# Patient Record
Sex: Male | Born: 1945 | Race: Black or African American | Hispanic: No | Smoking: Former smoker
Health system: Southern US, Community
[De-identification: ages and names within clinical notes are randomized; demographics above are authoritative.]

## PROBLEM LIST (undated history)

## (undated) DIAGNOSIS — F509 Eating disorder, unspecified: Secondary | ICD-10-CM

## (undated) DIAGNOSIS — M1990 Unspecified osteoarthritis, unspecified site: Secondary | ICD-10-CM

## (undated) HISTORY — DX: Eating disorder, unspecified: F50.9

## (undated) HISTORY — DX: Unspecified osteoarthritis, unspecified site: M19.90

## (undated) HISTORY — PX: HERNIA REPAIR: SHX51

---

## 2000-02-29 ENCOUNTER — Encounter: Admission: RE | Admit: 2000-02-29 | Discharge: 2000-02-29 | Payer: Self-pay | Admitting: Family Medicine

## 2000-02-29 ENCOUNTER — Encounter: Payer: Self-pay | Admitting: Family Medicine

## 2010-12-06 ENCOUNTER — Emergency Department (HOSPITAL_COMMUNITY)
Admission: EM | Admit: 2010-12-06 | Discharge: 2010-12-06 | Payer: Self-pay | Source: Home / Self Care | Admitting: Emergency Medicine

## 2010-12-13 LAB — CBC
HCT: 42.3 % (ref 39.0–52.0)
Hemoglobin: 13.9 g/dL (ref 13.0–17.0)
MCH: 29.1 pg (ref 26.0–34.0)
MCHC: 32.9 g/dL (ref 30.0–36.0)
MCV: 88.5 fL (ref 78.0–100.0)
Platelets: 215 10*3/uL (ref 150–400)
RBC: 4.78 MIL/uL (ref 4.22–5.81)
RDW: 14 % (ref 11.5–15.5)
WBC: 5.5 10*3/uL (ref 4.0–10.5)

## 2010-12-13 LAB — POCT I-STAT, CHEM 8
BUN: 18 mg/dL (ref 6–23)
Calcium, Ion: 1.11 mmol/L — ABNORMAL LOW (ref 1.12–1.32)
Chloride: 105 mEq/L (ref 96–112)
Creatinine, Ser: 1.4 mg/dL (ref 0.4–1.5)
Glucose, Bld: 97 mg/dL (ref 70–99)
HCT: 46 % (ref 39.0–52.0)
Hemoglobin: 15.6 g/dL (ref 13.0–17.0)
Potassium: 4.1 mEq/L (ref 3.5–5.1)
Sodium: 140 mEq/L (ref 135–145)
TCO2: 27 mmol/L (ref 0–100)

## 2010-12-13 LAB — DIFFERENTIAL
Basophils Absolute: 0 10*3/uL (ref 0.0–0.1)
Basophils Relative: 1 % (ref 0–1)
Eosinophils Absolute: 0.1 10*3/uL (ref 0.0–0.7)
Eosinophils Relative: 2 % (ref 0–5)
Lymphocytes Relative: 28 % (ref 12–46)
Lymphs Abs: 1.6 10*3/uL (ref 0.7–4.0)
Monocytes Absolute: 0.6 10*3/uL (ref 0.1–1.0)
Monocytes Relative: 11 % (ref 3–12)
Neutro Abs: 3.2 10*3/uL (ref 1.7–7.7)
Neutrophils Relative %: 57 % (ref 43–77)

## 2010-12-13 LAB — GLUCOSE, CAPILLARY: Glucose-Capillary: 103 mg/dL — ABNORMAL HIGH (ref 70–99)

## 2010-12-13 LAB — POCT CARDIAC MARKERS
CKMB, poc: <1 ng/mL — ABNORMAL LOW (ref 1.0–8.0)
Myoglobin, poc: 49.3 ng/mL (ref 12–200)
Troponin i, poc: <0.05 ng/mL (ref 0.00–0.09)

## 2013-12-24 ENCOUNTER — Ambulatory Visit: Payer: Self-pay | Admitting: Internal Medicine

## 2015-08-31 ENCOUNTER — Ambulatory Visit (INDEPENDENT_AMBULATORY_CARE_PROVIDER_SITE_OTHER)
Admission: RE | Admit: 2015-08-31 | Discharge: 2015-08-31 | Disposition: A | Discharge status: Home / Self Care | Payer: Medicare Other | Source: Ambulatory Visit | Attending: Primary Care | Admitting: Primary Care

## 2015-08-31 ENCOUNTER — Telehealth: Payer: Self-pay | Admitting: Primary Care

## 2015-08-31 ENCOUNTER — Ambulatory Visit (INDEPENDENT_AMBULATORY_CARE_PROVIDER_SITE_OTHER): Payer: Medicare Other | Admitting: Primary Care

## 2015-08-31 ENCOUNTER — Encounter: Payer: Self-pay | Admitting: Primary Care

## 2015-08-31 VITALS — BP 152/90 | HR 108 | Temp 98.1°F | Ht 75.25 in | Wt 176.0 lb

## 2015-08-31 DIAGNOSIS — R030 Elevated blood-pressure reading, without diagnosis of hypertension: Secondary | ICD-10-CM | POA: Diagnosis not present

## 2015-08-31 DIAGNOSIS — M25561 Pain in right knee: Secondary | ICD-10-CM

## 2015-08-31 DIAGNOSIS — M25461 Effusion, right knee: Secondary | ICD-10-CM | POA: Diagnosis not present

## 2015-08-31 DIAGNOSIS — M25569 Pain in unspecified knee: Secondary | ICD-10-CM | POA: Insufficient documentation

## 2015-08-31 DIAGNOSIS — IMO0001 Reserved for inherently not codable concepts without codable children: Secondary | ICD-10-CM | POA: Insufficient documentation

## 2015-08-31 NOTE — Progress Notes (Signed)
Subjective:    Patient ID: Albert Mendoza, male    DOB: 07/14/1946, 69 y.o.   MRN: [id number]  HPI  Albert Mendoza is a 69 year old male who presents today to establish care and discuss the problems mentioned below. Will obtain old records. He's not had care with a PCP in over 8 years.  1) Knee pain: Located to the medial aspect of the right knee and has been present for the past 2 weeks. He will notice a "crackle" sound intermittently when standing and notices stiffness after resting for a while. He also notices intermittent tingling to the tips of the toes of his bilateral feet. Denies recent injury, swelling, back pain. He is active at home with yard work and walks a lot daily. He's taken aleve arthritis without improvement in symptoms.   2) Elevated Blood Pressure Reading: Elevated in the clinic today. He's had no history of elevated blood pressure readings in the past. Denies chest pain, headaches, shortness of breath.   3) Alcohol Consumption: He will drink 3-4 ounces of Brandi daily and has done so for the past year since his wife's passing. PHQ 2 score of 0. Denies abdominal pain, changes in skin color.  Review of Systems  Constitutional: Negative for unexpected weight change.  HENT: Negative for rhinorrhea.   Respiratory: Negative for cough and shortness of breath.   Cardiovascular: Negative for chest pain.  Gastrointestinal: Negative for diarrhea and constipation.  Genitourinary: Negative for difficulty urinating.  Musculoskeletal:       Right knee pain, see HPI  Skin: Negative for rash.  Allergic/Immunologic: Negative for environmental allergies.  Neurological: Positive for numbness. Negative for dizziness and headaches.  Psychiatric/Behavioral:       Denies concerns for anxiety or depression. He is grieving the loss of his wife.       Past Medical History  Diagnosis Date  . Arthritis   . Eating disorder     Social History   Social History  . Marital Status: Widowed     Spouse Name: N/A  . Number of Children: N/A  . Years of Education: N/A   Occupational History  . Not on file.   Social History Main Topics  . Smoking status: Never Smoker   . Smokeless tobacco: Not on file  . Alcohol Use: 0.0 oz/week    0 Standard drinks or equivalent per week     Comment: about 2 oz a day  . Drug Use: Not on file  . Sexual Activity: Not on file   Other Topics Concern  . Not on file   Social History Narrative  . No narrative on file    History reviewed. No pertinent past surgical history.  History reviewed. No pertinent family history.  Allergies not on file  No current outpatient prescriptions on file prior to visit.   No current facility-administered medications on file prior to visit.    BP 152/90 mmHg  Pulse 108  Temp(Src) 98.1 F (36.7 C) (Oral)  Ht 6' 3.25" (1.911 m)  Wt 176 lb (79.833 kg)  BMI 21.86 kg/m2  SpO2 98%    Objective:   Physical Exam  Constitutional: He is oriented to person, place, and time. He appears well-nourished.  HENT:  Head: Normocephalic.  Cardiovascular: Normal rate and regular rhythm.   Pulmonary/Chest: Effort normal and breath sounds normal.  Musculoskeletal:       Right knee: He exhibits decreased range of motion. He exhibits no swelling, no deformity and normal alignment. Tenderness  found. Medial joint line tenderness noted.  Neurological: He is alert and oriented to person, place, and time.  Skin: Skin is warm and dry.  Psychiatric: He has a normal mood and affect.          Assessment & Plan:  Alcohol consumption:  Drinks ETOH every night since wife's passing 1 year ago. PHQ 2 score of 0 today. Will continue to monitor and screen for depression.

## 2015-08-31 NOTE — Progress Notes (Signed)
Pre visit review using our clinic review tool, if applicable. No additional management support is needed unless otherwise documented below in the visit note. 

## 2015-08-31 NOTE — Assessment & Plan Note (Signed)
Located to medial aspect of right knee. Present for 2 weeks without relief from ibuprofen. Stiffness, some crepitus. No injury. Decreased ROM during exam without laxity or deformity. Xrays pending. Tylenol, knee brace, ice, rest. Suspect arthritis and will treat with supportive measures.

## 2015-08-31 NOTE — Telephone Encounter (Signed)
See results note for x-ray on 08/31/15.

## 2015-08-31 NOTE — Telephone Encounter (Signed)
Pt returned your call, please call back at [phone]

## 2015-08-31 NOTE — Patient Instructions (Addendum)
Complete xray(s) prior to leaving today. I will contact you regarding your results.  For knee pain:  Apply a knee brace/wrap to use when walking.  Ice knee three times daily for about 20 minutes at a time.  You may take tylenol 500 mg four times daily as needed for pain or ibuprofen 600 mg three times daily as needed for pain.  Please schedule a physical with me in the next 1-2 months. You will also schedule a lab only appointment one week prior. We will discuss your lab results during your physical.  It was a pleasure to meet you today! Please don't hesitate to call me with any questions. Welcome to [facility]!     Knee Pain The knee is the complex joint between your thigh and your lower leg. It is made up of bones, tendons, ligaments, and cartilage. The bones that make up the knee are:  The femur in the thigh.  The tibia and fibula in the lower leg.  The patella or kneecap riding in the groove on the lower femur. CAUSES  Knee pain is a common complaint with many causes. A few of these causes are:  Injury, such as:  A ruptured ligament or tendon injury.  Torn cartilage.  Medical conditions, such as:  Gout  Arthritis  Infections  Overuse, over training, or overdoing a physical activity. Knee pain can be minor or severe. Knee pain can accompany debilitating injury. Minor knee problems often respond well to self-care measures or get well on their own. More serious injuries may need medical intervention or even surgery. SYMPTOMS The knee is complex. Symptoms of knee problems can vary widely. Some of the problems are:  Pain with movement and weight bearing.  Swelling and tenderness.  Buckling of the knee.  Inability to straighten or extend your knee.  Your knee locks and you cannot straighten it.  Warmth and redness with pain and fever.  Deformity or dislocation of the kneecap. DIAGNOSIS  Determining what is wrong may be very straight forward such as when there  is an injury. It can also be challenging because of the complexity of the knee. Tests to make a diagnosis may include:  Your caregiver taking a history and doing a physical exam.  Routine X-rays can be used to rule out other problems. X-rays will not reveal a cartilage tear. Some injuries of the knee can be diagnosed by:  Arthroscopy a surgical technique by which a small video camera is inserted through tiny incisions on the sides of the knee. This procedure is used to examine and repair internal knee joint problems. Tiny instruments can be used during arthroscopy to repair the torn knee cartilage (meniscus).  Arthrography is a radiology technique. A contrast liquid is directly injected into the knee joint. Internal structures of the knee joint then become visible on X-ray film.  An MRI scan is a non X-ray radiology procedure in which magnetic fields and a computer produce two- or three-dimensional images of the inside of the knee. Cartilage tears are often visible using an MRI scanner. MRI scans have largely replaced arthrography in diagnosing cartilage tears of the knee.  Blood work.  Examination of the fluid that helps to lubricate the knee joint (synovial fluid). This is done by taking a sample out using a needle and a syringe. TREATMENT The treatment of knee problems depends on the cause. Some of these treatments are:  Depending on the injury, proper casting, splinting, surgery, or physical therapy care will be needed.  Give yourself adequate recovery time. Do not overuse your joints. If you begin to get sore during workout routines, back off. Slow down or do fewer repetitions.  For repetitive activities such as cycling or running, maintain your strength and nutrition.  Alternate muscle groups. For example, if you are a weight lifter, work the upper body on one day and the lower body the next.  Either tight or weak muscles do not give the proper support for your knee. Tight or weak  muscles do not absorb the stress placed on the knee joint. Keep the muscles surrounding the knee strong.  Take care of mechanical problems.  If you have flat feet, orthotics or special shoes may help. See your caregiver if you need help.  Arch supports, sometimes with wedges on the inner or outer aspect of the heel, can help. These can shift pressure away from the side of the knee most bothered by osteoarthritis.  A brace called an "unloader" brace also may be used to help ease the pressure on the most arthritic side of the knee.  If your caregiver has prescribed crutches, braces, wraps or ice, use as directed. The acronym for this is PRICE. This means protection, rest, ice, compression, and elevation.  Nonsteroidal anti-inflammatory drugs (NSAIDs), can help relieve pain. But if taken immediately after an injury, they may actually increase swelling. Take NSAIDs with food in your stomach. Stop them if you develop stomach problems. Do not take these if you have a history of ulcers, stomach pain, or bleeding from the bowel. Do not take without your caregiver's approval if you have problems with fluid retention, heart failure, or kidney problems.  For ongoing knee problems, physical therapy may be helpful.  Glucosamine and chondroitin are over-the-counter dietary supplements. Both may help relieve the pain of osteoarthritis in the knee. These medicines are different from the usual anti-inflammatory drugs. Glucosamine may decrease the rate of cartilage destruction.  Injections of a corticosteroid drug into your knee joint may help reduce the symptoms of an arthritis flare-up. They may provide pain relief that lasts a few months. You may have to wait a few months between injections. The injections do have a small increased risk of infection, water retention, and elevated blood sugar levels.  Hyaluronic acid injected into damaged joints may ease pain and provide lubrication. These injections may work by  reducing inflammation. A series of shots may give relief for as long as 6 months.  Topical painkillers. Applying certain ointments to your skin may help relieve the pain and stiffness of osteoarthritis. Ask your pharmacist for suggestions. Many over the-counter products are approved for temporary relief of arthritis pain.  In some countries, doctors often prescribe topical NSAIDs for relief of chronic conditions such as arthritis and tendinitis. A review of treatment with NSAID creams found that they worked as well as oral medications but without the serious side effects. PREVENTION  Maintain a healthy weight. Extra pounds put more strain on your joints.  Get strong, stay limber. Weak muscles are a common cause of knee injuries. Stretching is important. Include flexibility exercises in your workouts.  Be smart about exercise. If you have osteoarthritis, chronic knee pain or recurring injuries, you may need to change the way you exercise. This does not mean you have to stop being active. If your knees ache after jogging or playing basketball, consider switching to swimming, water aerobics, or other low-impact activities, at least for a few days a week. Sometimes limiting high-impact activities will provide  relief.  Make sure your shoes fit well. Choose footwear that is right for your sport.  Protect your knees. Use the proper gear for knee-sensitive activities. Use kneepads when playing volleyball or laying carpet. Buckle your seat belt every time you drive. Most shattered kneecaps occur in car accidents.  Rest when you are tired. SEEK MEDICAL CARE IF:  You have knee pain that is continual and does not seem to be getting better.  SEEK IMMEDIATE MEDICAL CARE IF:  Your knee joint feels hot to the touch and you have a high fever. MAKE SURE YOU:   Understand these instructions.  Will watch your condition.  Will get help right away if you are not doing well or get worse. Document Released:  09/11/2007 Document Revised: 02/06/2012 Document Reviewed: 09/11/2007 [facility] Patient Information 2015 [place], [place]. This information is not intended to replace advice given to you by your health care provider. Make sure you discuss any questions you have with your health care provider.

## 2015-08-31 NOTE — Assessment & Plan Note (Signed)
Slightly above goal in the clinic today at 150/92. He has no prior history of elevated readings in the past. Denies chest pain, SOB, headaches. Will continue to closely monitor.

## 2015-10-29 ENCOUNTER — Other Ambulatory Visit: Payer: Medicare Other

## 2015-11-04 ENCOUNTER — Encounter: Payer: Medicare Other | Admitting: Primary Care

## 2015-12-23 ENCOUNTER — Other Ambulatory Visit: Payer: Self-pay | Admitting: Primary Care

## 2015-12-23 DIAGNOSIS — Z0000 Encounter for general adult medical examination without abnormal findings: Secondary | ICD-10-CM

## 2015-12-23 DIAGNOSIS — Z125 Encounter for screening for malignant neoplasm of prostate: Secondary | ICD-10-CM

## 2015-12-23 DIAGNOSIS — Z13220 Encounter for screening for lipoid disorders: Secondary | ICD-10-CM

## 2015-12-23 DIAGNOSIS — R030 Elevated blood-pressure reading, without diagnosis of hypertension: Secondary | ICD-10-CM

## 2015-12-23 DIAGNOSIS — IMO0001 Reserved for inherently not codable concepts without codable children: Secondary | ICD-10-CM

## 2015-12-31 ENCOUNTER — Other Ambulatory Visit: Payer: Self-pay

## 2016-01-07 ENCOUNTER — Encounter: Payer: Medicare Other | Admitting: Primary Care

## 2021-09-20 ENCOUNTER — Emergency Department (HOSPITAL_COMMUNITY)
Admission: EM | Admit: 2021-09-20 | Discharge: 2021-09-20 | Disposition: A | Discharge status: Home / Self Care | Payer: Medicare PPO | Attending: Emergency Medicine | Admitting: Emergency Medicine

## 2021-09-20 ENCOUNTER — Emergency Department (HOSPITAL_COMMUNITY): Payer: Medicare PPO

## 2021-09-20 DIAGNOSIS — Z23 Encounter for immunization: Secondary | ICD-10-CM | POA: Diagnosis not present

## 2021-09-20 DIAGNOSIS — W19XXXA Unspecified fall, initial encounter: Secondary | ICD-10-CM

## 2021-09-20 DIAGNOSIS — S0990XA Unspecified injury of head, initial encounter: Secondary | ICD-10-CM

## 2021-09-20 DIAGNOSIS — M47892 Other spondylosis, cervical region: Secondary | ICD-10-CM | POA: Diagnosis not present

## 2021-09-20 DIAGNOSIS — W01198A Fall on same level from slipping, tripping and stumbling with subsequent striking against other object, initial encounter: Secondary | ICD-10-CM | POA: Insufficient documentation

## 2021-09-20 DIAGNOSIS — S022XXA Fracture of nasal bones, initial encounter for closed fracture: Secondary | ICD-10-CM | POA: Diagnosis not present

## 2021-09-20 DIAGNOSIS — S0181XA Laceration without foreign body of other part of head, initial encounter: Secondary | ICD-10-CM | POA: Diagnosis not present

## 2021-09-20 DIAGNOSIS — S022XXB Fracture of nasal bones, initial encounter for open fracture: Secondary | ICD-10-CM

## 2021-09-20 DIAGNOSIS — M47812 Spondylosis without myelopathy or radiculopathy, cervical region: Secondary | ICD-10-CM

## 2021-09-20 LAB — CBC WITH DIFFERENTIAL/PLATELET
Abs Immature Granulocytes: 0.03 10*3/uL (ref 0.00–0.07)
Basophils Absolute: 0.1 10*3/uL (ref 0.0–0.1)
Basophils Relative: 1 %
Eosinophils Absolute: 0 10*3/uL (ref 0.0–0.5)
Eosinophils Relative: 0 %
HCT: 44.8 % (ref 39.0–52.0)
Hemoglobin: 15 g/dL (ref 13.0–17.0)
Immature Granulocytes: 0 %
Lymphocytes Relative: 11 %
Lymphs Abs: 1 10*3/uL (ref 0.7–4.0)
MCH: 30.7 pg (ref 26.0–34.0)
MCHC: 33.5 g/dL (ref 30.0–36.0)
MCV: 91.8 fL (ref 80.0–100.0)
Monocytes Absolute: 0.7 10*3/uL (ref 0.1–1.0)
Monocytes Relative: 7 %
Neutro Abs: 7.7 10*3/uL (ref 1.7–7.7)
Neutrophils Relative %: 81 %
Platelets: 213 10*3/uL (ref 150–400)
RBC: 4.88 MIL/uL (ref 4.22–5.81)
RDW: 14.7 % (ref 11.5–15.5)
WBC: 9.5 10*3/uL (ref 4.0–10.5)
nRBC: 0 % (ref 0.0–0.2)

## 2021-09-20 LAB — BASIC METABOLIC PANEL
Anion gap: 8 (ref 5–15)
BUN: 13 mg/dL (ref 8–23)
CO2: 28 mmol/L (ref 22–32)
Calcium: 8.8 mg/dL — ABNORMAL LOW (ref 8.9–10.3)
Chloride: 102 mmol/L (ref 98–111)
Creatinine, Ser: 1.24 mg/dL (ref 0.61–1.24)
GFR, Estimated: >60 mL/min (ref >60)
Glucose, Bld: 114 mg/dL — ABNORMAL HIGH (ref 70–99)
Potassium: 4.3 mmol/L (ref 3.5–5.1)
Sodium: 138 mmol/L (ref 135–145)

## 2021-09-20 MED ORDER — OXYCODONE-ACETAMINOPHEN 5-325 MG PO TABS
1.0000 | ORAL_TABLET | Freq: Once | ORAL | Status: AC
  Administered 2021-09-20: 1 via ORAL
  Filled 2021-09-20: qty 1

## 2021-09-20 MED ORDER — LIDOCAINE-EPINEPHRINE (PF) 2 %-1:200000 IJ SOLN
20.0000 mL | Freq: Once | INTRAMUSCULAR | Status: AC
  Administered 2021-09-20: 20 mL
  Filled 2021-09-20: qty 20

## 2021-09-20 MED ORDER — TETANUS-DIPHTH-ACELL PERTUSSIS 5-2.5-18.5 LF-MCG/0.5 IM SUSY
0.5000 mL | PREFILLED_SYRINGE | Freq: Once | INTRAMUSCULAR | Status: AC
  Administered 2021-09-20: 0.5 mL via INTRAMUSCULAR
  Filled 2021-09-20: qty 0.5

## 2021-09-20 NOTE — Discharge Instructions (Addendum)
There were no serious injuries from the fall.  You do have a nose fracture that will likely heal without problems.  There is a small risk of it getting infected, which may require additional treatment.  Watch for signs of increasing pain, drainage or swelling of the nose.  Clean the wounds with soap and water daily and apply an antibiotic ointment such as Neosporin or bacitracin.  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  You also likely has some arthritis in your knee which contributed to the fall.  Treatment for this should be adequate with Tylenol.  We sent some blood work at your request, that can be followed through MyChart.  Also discussed the results with the medical provider when you see them for suture removal in 5 to 7 days.  Return here, if needed for problems.

## 2021-09-20 NOTE — ED Provider Notes (Signed)
Emergency Medicine Provider Triage Evaluation Note  Albert Mendoza , a 75 y.o. male  was evaluated in triage.  Pt complains of acute fall just prior to arrival.  Patient states that when he got up to go to the bathroom his knees gave out and he fell striking the floor with his face.  Denies dizziness, loss of consciousness.  Patient reports he does not take blood thinners.  Has associated neck pain and bilateral shoulder pain.  Initial triage stated pain in his knees however denies any pain at this time.  Has been ambulatory since then.  Review of Systems  Positive: Laceration, neck pain, shoulder pain Negative: Of consciousness  Physical Exam  BP 130/81 (BP Location: Right Arm)   Pulse (!) 105   Temp (!) 97.3 F (36.3 C) (Oral)   Resp 18   SpO2 99%  Gen:   Awake, no distress   Resp:  Normal effort  MSK:   Moves extremities without difficulty, strength 5/5 in the bilateral upper extremities.  No midline cervical tenderness or step-off. Other:  6cm laceration to the forehead and nose  Medical Decision Making  Medically screening exam initiated at 5:40 AM.  Appropriate orders placed.  Albert Mendoza was informed that the remainder of the evaluation will be completed by another provider, this initial triage assessment does not replace that evaluation, and the importance of remaining in the ED until their evaluation is complete.  Fall, facial laceration   Albert Mendoza 09/20/21 0543    Albert Lyons, MD 09/21/21 [phone]

## 2021-09-20 NOTE — ED Provider Notes (Signed)
[facility] EMERGENCY DEPARTMENT Provider Note   CSN: [id number] Arrival date & time: 09/20/21  0447     History Chief Complaint  Patient presents with   Lytle Michaels    Albert Mendoza is a 75 y.o. male.  HPI He presents for evaluation of an injury which occurred when he stood up from a couch where he had been watching TV.  His left knee gave way which caused him to fall forward onto his left knee.  He was able ambulate afterwards.  He noticed injuries to his face.  He denies neck pain, back pain, headache, focal weakness or paresthesia.  Does not feel like he lost consciousness.  He is here with his daughter.  There are no other known active modifying factors.    Past Medical History:  Diagnosis Date   Arthritis    Eating disorder     Patient Active Problem List   Diagnosis Date Noted   Knee pain, acute 08/31/2015   Elevated blood pressure 08/31/2015    No past surgical history on file.     No family history on file.  Social History   Tobacco Use   Smoking status: Never  Substance Use Topics   Alcohol use: Yes    Alcohol/week: 0.0 standard drinks    Comment: about 2 oz a day    Home Medications Prior to Admission medications   Medication Sig Start Date End Date Taking? Authorizing Provider  ibuprofen (ADVIL,MOTRIN) 200 MG tablet Take 200 mg by mouth as needed.    [provider]    Allergies    Patient has no known allergies.  Review of Systems   Review of Systems  All other systems reviewed and are negative.  Physical Exam Updated Vital Signs BP (!) 153/91   Pulse 90   Temp (!) 97.3 F (36.3 C) (Oral)   Resp 16   SpO2 100%   Physical Exam Vitals and nursing note reviewed.  Constitutional:      General: He is not in acute distress.    Appearance: He is well-developed. He is not ill-appearing, toxic-appearing or diaphoretic.  HENT:     Head: Normocephalic.     Comments: Laceration mid forehead, gaping, Y-shaped.   Laceration nose with deformity, left-sided swelling.  No active bleeding from the nares.  Normal TMJ motion.  No dental trauma.  No midface crepitation or deformity.    Right Ear: External ear normal.     Left Ear: External ear normal.     Nose: No congestion or rhinorrhea.     Mouth/Throat:     Mouth: Mucous membranes are moist.  Eyes:     Conjunctiva/sclera: Conjunctivae normal.     Pupils: Pupils are equal, round, and reactive to light.  Neck:     Trachea: Phonation normal.  Cardiovascular:     Rate and Rhythm: Normal rate and regular rhythm.     Heart sounds: Normal heart sounds.  Pulmonary:     Effort: Pulmonary effort is normal. No respiratory distress.     Breath sounds: Normal breath sounds. No stridor.  Abdominal:     Palpations: Abdomen is soft.     Tenderness: There is no abdominal tenderness.  Musculoskeletal:        General: Tenderness (Left knee, mild.) present. Normal range of motion.     Cervical back: Normal range of motion and neck supple.     Comments: Normal range of motion, arms and legs.  Skin:    General:  Skin is warm and dry.  Neurological:     Mental Status: He is alert and oriented to person, place, and time.     Cranial Nerves: No cranial nerve deficit.     Sensory: No sensory deficit.     Motor: No abnormal muscle tone.     Coordination: Coordination normal.  Psychiatric:        Mood and Affect: Mood normal.        Behavior: Behavior normal.        Thought Content: Thought content normal.        Judgment: Judgment normal.    ED Results / Procedures / Treatments   Labs (all labs ordered are listed, but only abnormal results are displayed) Labs Reviewed  BASIC METABOLIC PANEL  CBC WITH DIFFERENTIAL/PLATELET    EKG None  Radiology CT HEAD WO CONTRAST (5MM)  Result Date: 09/20/2021 CLINICAL DATA:  75 year old male status post fall at home. EXAM: CT HEAD WITHOUT CONTRAST TECHNIQUE: Contiguous axial images were obtained from the base of the  skull through the vertex without intravenous contrast. COMPARISON:  None. FINDINGS: Brain: No midline shift, ventriculomegaly, mass effect, evidence of mass lesion, intracranial hemorrhage or evidence of cortically based acute infarction. Mild generalized cerebral volume loss. Small area of chronic appearing cortical encephalomalacia at the left superior perirolandic cortex on series 3, image 22. But otherwise gray-white matter differentiation appears within normal limits for age. Vascular: No suspicious intracranial vascular hyperdensity. Skull: No fracture identified. Sinuses/Orbits: Visualized paranasal sinuses and mastoids are clear. Other: Deep left forehead scalp laceration appears to extend to the outer periosteum on series 4, image 14. Associated bridge of nose and other regional forehead soft tissue swelling and/or hematoma. No tracking soft tissue gas. Sebaceous or dermal type cyst at the vertex. Visible orbits soft tissues remain normal. IMPRESSION: 1. Deep forehead scalp laceration which might extend to the periosteum of the frontal bone. Bridge of nose and other regional forehead soft tissue swelling. No underlying skull fracture identified. See also Face CT. 2. No acute intracranial abnormality. 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 Electronically Signed   By: Genevie Ann M.D.   On: 09/20/2021 07:35   CT Cervical Spine Wo Contrast  Result Date: 09/20/2021 CLINICAL DATA:  75 year old male status post fall at home. EXAM: CT CERVICAL SPINE WITHOUT CONTRAST TECHNIQUE: Multidetector CT imaging of the cervical spine was performed without intravenous contrast. Multiplanar CT image reconstructions were also generated. COMPARISON:  CT head and face today. FINDINGS: Alignment: Reversal of cervical lordosis. Cervicothoracic junction alignment is within normal limits. Bilateral posterior element alignment is within normal limits. Skull base and vertebrae: Visualized skull base is intact. No  atlanto-occipital dissociation. C1 and C2 appear intact and aligned. No acute osseous abnormality identified. Degenerative appearing vertebral sclerosis. Soft tissues and spinal canal: No prevertebral fluid or swelling. No visible canal hematoma. Calcified left carotid bifurcation atherosclerosis. Disc levels: Advanced cervical disc and endplate degeneration H0-Q6 through C6-C7. Vacuum containing endplate Schmorl's node associated, especially at C4-C5. Up to mild associated cervical spinal stenosis. Upper chest: Visible upper thoracic levels appear intact, visible upper lungs appear clear, negative visible noncontrast superior mediastinum. IMPRESSION: 1. No acute traumatic injury identified in the cervical spine. 2. Advanced cervical spine disc and endplate degeneration with up to mild spinal stenosis. Electronically Signed   By: Genevie Ann M.D.   On: 09/20/2021 07:41   CT Maxillofacial Wo Contrast  Result Date: 09/20/2021 CLINICAL DATA:  75 year old male status post fall at home.  EXAM: CT MAXILLOFACIAL WITHOUT CONTRAST TECHNIQUE: Multidetector CT imaging of the maxillofacial structures was performed. Multiplanar CT image reconstructions were also generated. COMPARISON:  Head and cervical spine CT today. FINDINGS: Osseous: Mandible intact and normally located. No maxilla, zygoma, or pterygoid fractures. Comminuted and mildly displaced nasal bone fracture mostly on the left. Overlying laceration and regional soft tissue swelling. See series 4, image 58. Leftward nasal septal deviation and spurring which is probably chronic. Central skull base appears intact. Cervical spine reported separately. Orbits: No orbital wall fracture. Globes and intraorbital soft tissues appear normal. Sinuses: Clear throughout. Soft tissues: Superficial soft tissue injury at the bridge of the nose and forehead. Elongated stylohyoid ligament calcification bilaterally. Negative visible noncontrast deep soft tissue spaces of the face aside  from bulky left ICA origin calcified atherosclerosis. Limited intracranial: Reported separately. IMPRESSION: 1. Comminuted and mildly displaced nasal bone fractures mostly with overlying laceration and soft tissue swelling. 2. No other facial fracture or acute traumatic injury identified in the face. Electronically Signed   By: Genevie Ann M.D.   On: 09/20/2021 07:38    Procedures .Marland KitchenLaceration Repair  Date/Time: 09/20/2021 11:03 AM Performed by: Daleen Bo, MD Authorized by: Daleen Bo, MD   Consent:    Consent obtained:  Verbal   Consent given by:  Patient   Risks discussed:  Infection, pain and poor cosmetic result Universal protocol:    Procedure explained and questions answered to patient or proxy's satisfaction: yes     Immediately prior to procedure, a time out was called: yes   Anesthesia:    Anesthesia method:  Local infiltration   Local anesthetic:  Lidocaine 2% WITH epi Laceration details:    Location:  Face   Face location:  Forehead   Length (cm):  7   Depth (mm):  9 Pre-procedure details:    Preparation:  Patient was prepped and draped in usual sterile fashion and imaging obtained to evaluate for foreign bodies Exploration:    Limited defect created (wound extended): no     Hemostasis achieved with:  Direct pressure   Wound extent: no areolar tissue violation noted, no fascia violation noted, no foreign bodies/material noted and no muscle damage noted     Contaminated: no   Treatment:    Area cleansed with:  Povidone-iodine   Amount of cleaning:  Standard   Irrigation solution:  Sterile saline   Irrigation method:  Pressure wash   Visualized foreign bodies/material removed: no     Debridement:  None   Undermining:  None   Scar revision: no     Layers/structures repaired:  Deep subcutaneous Deep subcutaneous:    Suture size:  4-0   Suture material:  Vicryl   Suture technique:  Vertical mattress   Number of sutures:  4 Skin repair:    Repair method:   Sutures   Suture size:  4-0   Suture material:  Prolene   Number of sutures:  9 Approximation:    Approximation:  Loose Repair type:    Repair type:  Intermediate Post-procedure details:    Dressing:  Antibiotic ointment   Procedure completion:  Tolerated well, no immediate complications .Marland KitchenLaceration Repair  Date/Time: 09/20/2021 11:07 AM Performed by: Daleen Bo, MD Authorized by: Daleen Bo, MD   Consent:    Consent obtained:  Verbal   Risks discussed:  Infection and pain Universal protocol:    Procedure explained and questions answered to patient or proxy's satisfaction: yes     Immediately prior to procedure,  a time out was called: yes     Patient identity confirmed:  Verbally with patient Anesthesia:    Anesthesia method:  Local infiltration   Local anesthetic:  Lidocaine 2% WITH epi Laceration details:    Location:  Face   Face location:  Nose   Length (cm):  1.5   Depth (mm):  5 Pre-procedure details:    Preparation:  Patient was prepped and draped in usual sterile fashion and imaging obtained to evaluate for foreign bodies Exploration:    Limited defect created (wound extended): no     Imaging outcome: foreign body not noted     Wound extent: foreign bodies/material and underlying fracture     Wound extent: no areolar tissue violation noted, no fascia violation noted, no muscle damage noted, no tendon damage noted and no vascular damage noted     Foreign bodies/material:  Fragments of bone   Contaminated: no   Treatment:    Area cleansed with:  Povidone-iodine   Amount of cleaning:  Standard   Irrigation solution:  Sterile saline   Irrigation method:  Syringe   Debridement:  Minimal   Scar revision: no   Skin repair:    Repair method:  Sutures   Suture size:  4-0   Suture material:  Prolene   Suture technique:  Simple interrupted   Number of sutures:  4 Approximation:    Approximation:  Loose Repair type:    Repair type:  Simple Post-procedure  details:    Dressing:  Antibiotic ointment   Procedure completion:  Tolerated well, no immediate complications   Medications Ordered in ED Medications  lidocaine-EPINEPHrine (XYLOCAINE W/EPI) 2 %-1:200000 (PF) injection 20 mL (20 mLs Infiltration Given 09/20/21 0927)  Tdap (BOOSTRIX) injection 0.5 mL (0.5 mLs Intramuscular Given 09/20/21 0927)  oxyCODONE-acetaminophen (PERCOCET/ROXICET) 5-325 MG per tablet 1 tablet (1 tablet Oral Given 09/20/21 6286)    ED Course  I have reviewed the triage vital signs and the nursing notes.  Pertinent labs & imaging results that were available during my care of the patient were reviewed by me and considered in my medical decision making (see chart for details).    MDM Rules/Calculators/A&P                            Patient Vitals for the past 24 hrs:  BP Temp Temp src Pulse Resp SpO2  09/20/21 1107 (!) 153/91 -- -- 90 16 100 %  09/20/21 0928 (!) 163/99 -- -- 97 15 100 %  09/20/21 0735 (!) 142/76 -- -- 99 13 99 %  09/20/21 0535 130/81 (!) 97.3 F (36.3 C) Oral (!) 105 18 99 %    11:09 AM Reevaluation with update and discussion. After initial assessment and treatment, an updated evaluation reveals he is comfortable after suturing.  Daughter with patient and requested blood work because the patient has not seen a doctor in many years.  CBC and BMP ordered.  They will check results by MyChart and talk to the provider they see for suture removal for further care and treatment as needed.  Findings discussed and questions answered. Daleen Bo   Medical Decision Making:  This patient is presenting for evaluation of injuries from fall, which does require a range of treatment options, and is a complaint that involves a moderate risk of morbidity and mortality. The differential diagnoses include contusion, head injury, facial injuries, skin soft tissue injuries, spine injury, provoked fall. I decided  to review old records, and in summary elderly male,  does not take medications regularly, presenting with injury that occurred after he stood up and felt his left knee give way.  He has had some preceding discomfort in his knee and wonders if he has arthritis in it..  I obtained additional historical information from daughter at bedside.  Clinical Laboratory Tests Ordered, included CBC and Metabolic panel. Radiologic Tests Ordered, included CT head, CT face, CT cervical spine.  I independently Visualized: Radiographic images, which show nasal fracture, uncomplicated, no other acute injuries.   Critical Interventions-clinical patient, radiography, wound repair, sutures x2 by me, laboratories ordered, discussion with patient and daughter  After These Interventions, the Patient was reevaluated and was found stable for discharge.  Fall likely mechanical associated with left knee pain when standing.  Screening evaluation with imaging does not show serious problems.  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  Uncomplicated nasal fracture, bony fragments removed during closure.  He will not likely require ongoing management as there is no significant deformity.  He will be referred to ENT on a as needed basis.  I suggested he have sutures removed in 5 to 7 days and at that time he can discuss results of CBC and be met with the provider.  CRITICAL CARE-no Performed by: Daleen Bo  Nursing Notes Reviewed/ Care Coordinated Applicable Imaging Reviewed Interpretation of Laboratory Data incorporated into ED treatment  The patient appears reasonably screened and/or stabilized for discharge and I doubt any other medical condition or other [facility] requiring further screening, evaluation, or treatment in the ED at this time prior to discharge.  Plan: Home Medications-Tylenol for pain; Home Treatments-cryotherapy, wound care; return here if the recommended treatment, does not improve the symptoms;  Recommended follow up-suture removal 5 to 7 days.  PCP management, as needed     Final Clinical Impression(s) / ED Diagnoses Final diagnoses:  Fall, initial encounter  Injury of head, initial encounter  Facial laceration, initial encounter  Spondylosis of cervical region without myelopathy or radiculopathy  Open fracture of nasal bone, initial encounter    Rx / DC Orders ED Discharge Orders     None        Daleen Bo, MD 09/20/21 1118

## 2021-09-20 NOTE — ED Triage Notes (Signed)
Pt states that he was going to the bathroom and his knees gave out on him, hit head, not on thinners, laceration to bridge of nose and forehead, no LOC, c/o bilateral shoulder and L knee pain

## 2021-09-26 ENCOUNTER — Other Ambulatory Visit: Payer: Self-pay

## 2021-09-26 ENCOUNTER — Ambulatory Visit
Admission: EM | Admit: 2021-09-26 | Discharge: 2021-09-26 | Disposition: A | Discharge status: Home / Self Care | Payer: Medicare PPO

## 2021-09-26 NOTE — ED Triage Notes (Signed)
Pt came in to have suture removed from his face and nose area.

## 2022-05-22 IMAGING — CT CT HEAD W/O CM
4 series · 15 of 47 positions shown, 17 images · non-contrast
Comparison: None.

CLINICAL DATA: 75-year-old male status post fall at home.

EXAM:
CT HEAD WITHOUT CONTRAST
TECHNIQUE: Contiguous axial images were obtained from the base of the skull
through the vertex without intravenous contrast.

[Series 3: head without · axial · non-contrast · 0.45mm/px · z∈[-44,+71]mm · 7 of 31 slices shown, 9 images]
[im 4/31  brain]
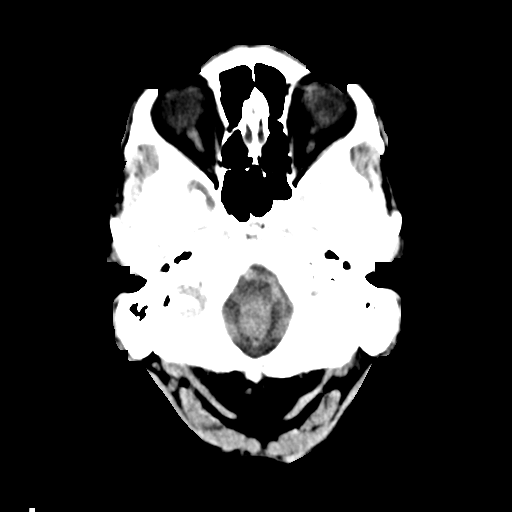
[im 4/31  bone]
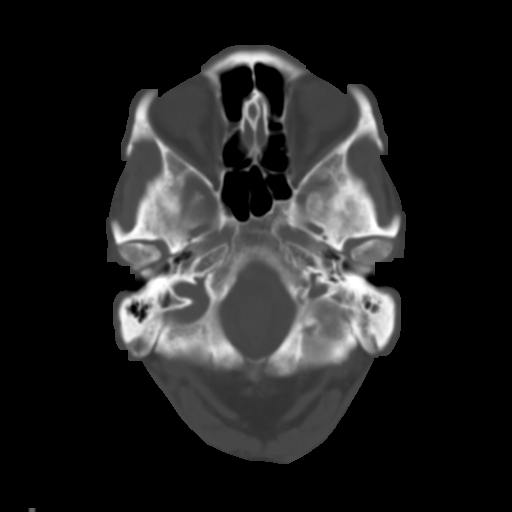
[im 8/31  brain]
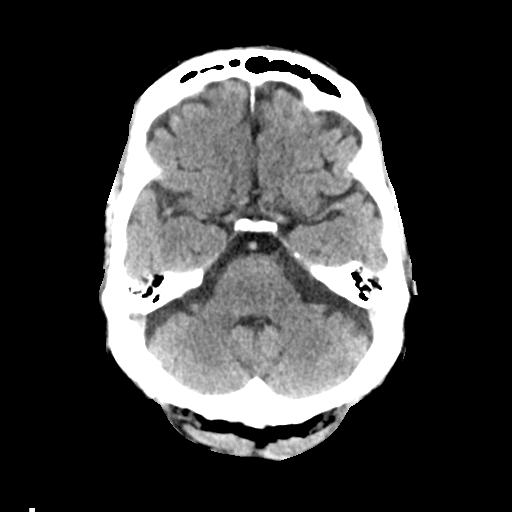
[im 12/31  brain]
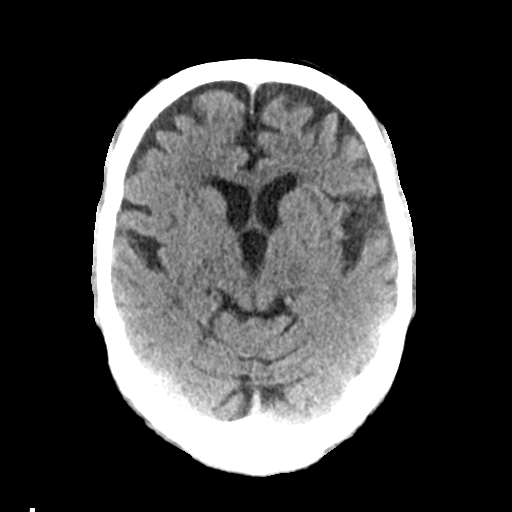
[im 16/31  brain]
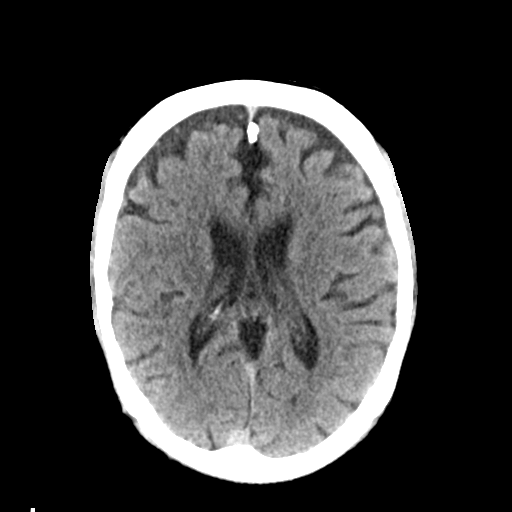
[im 19/31  brain]
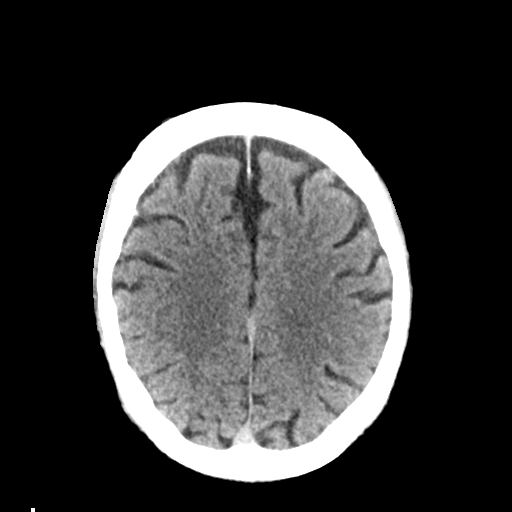
[im 19/31  bone]
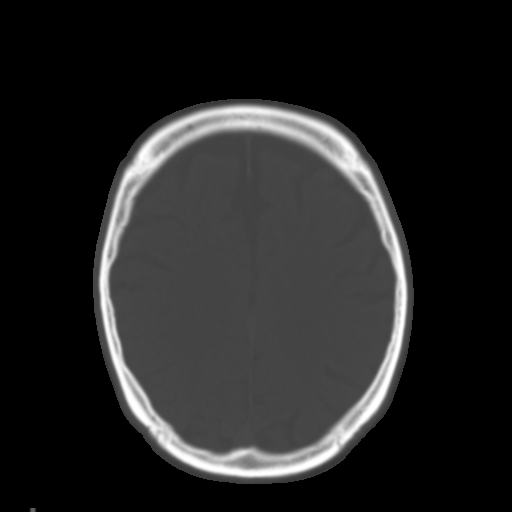
[im 23/31  brain]
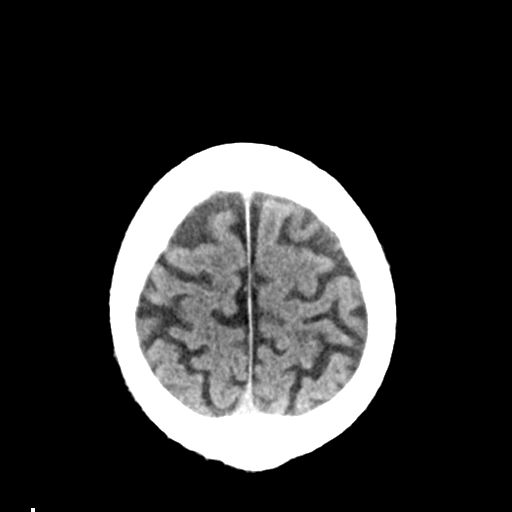
[im 27/31  brain]
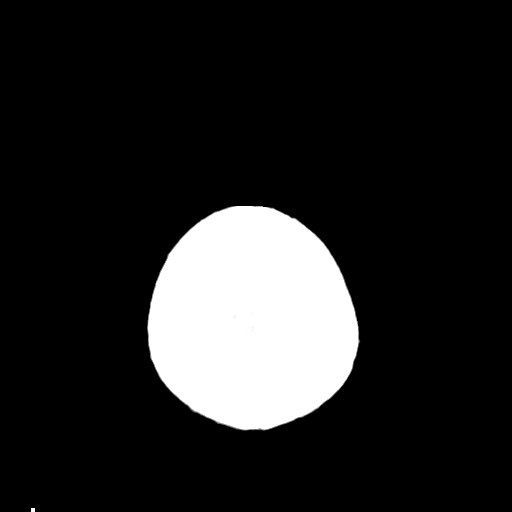

[Series 4: head bone · axial · 0.45mm/px · z∈[-45,-29]mm · 2 of 76 slices shown]
[im 8/76  bone]
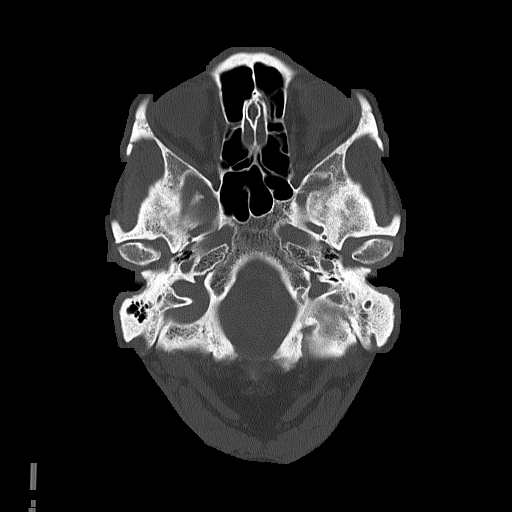
[im 16/76  bone]
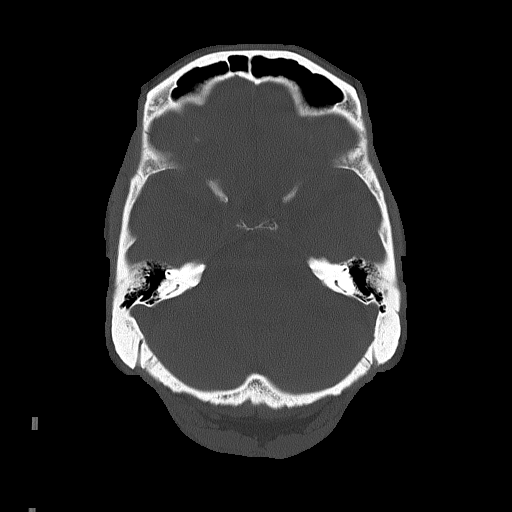

[Series 5: head without cor · coronal · non-contrast · 0.29mm/px · 3 of 72 slices shown]
[im 24/72  brain]
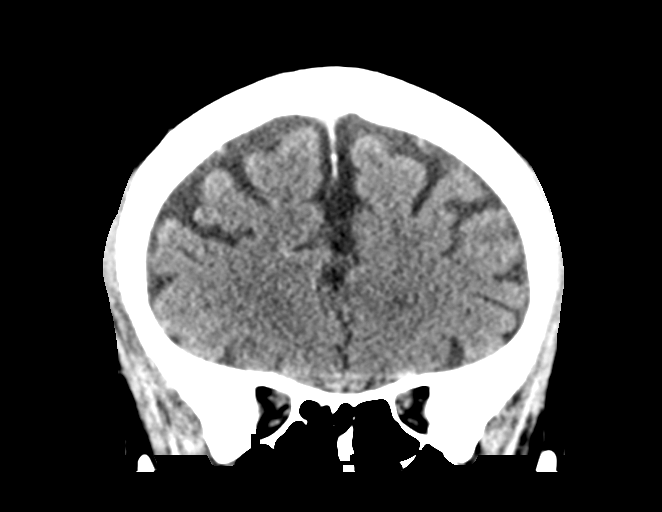
[im 32/72  brain]
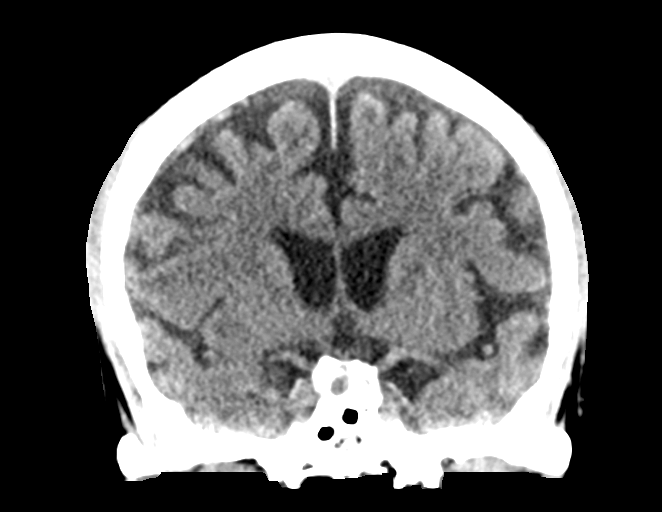
[im 40/72  brain]
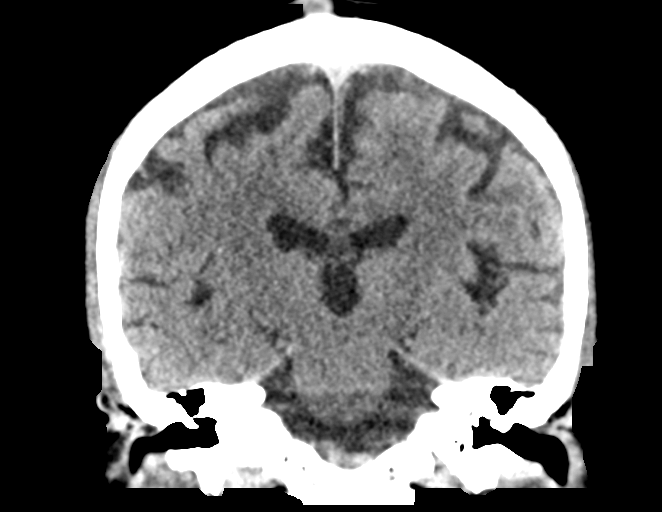

[Series 6: head without sag · sagittal · non-contrast · 0.35mm/px · 3 of 67 slices shown]
[im 23/67  brain]
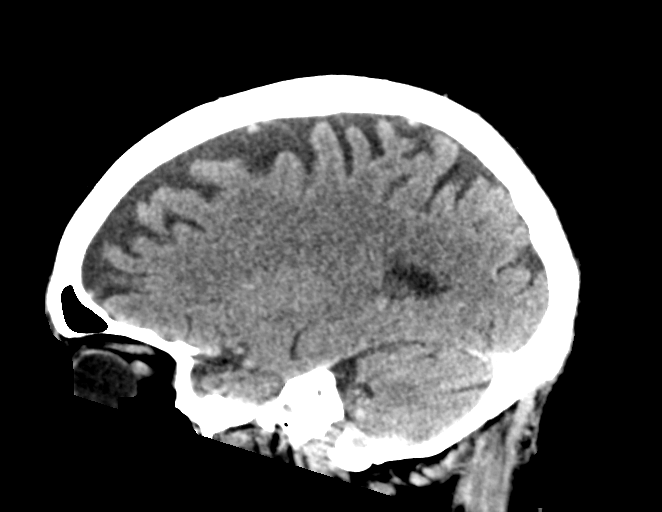
[im 34/67  brain]
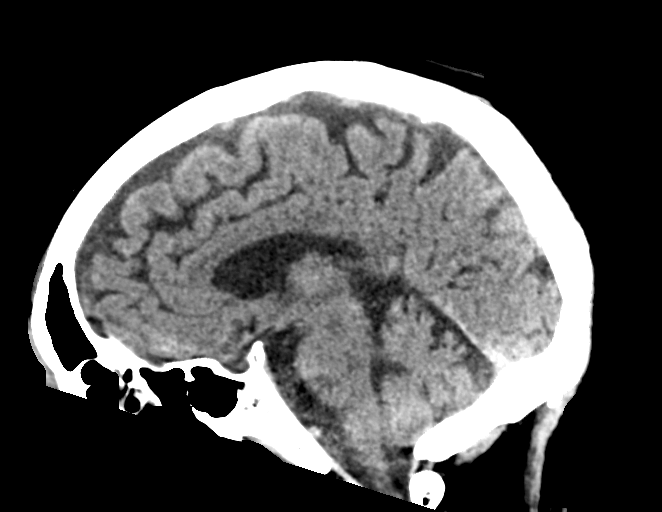
[im 45/67  brain]
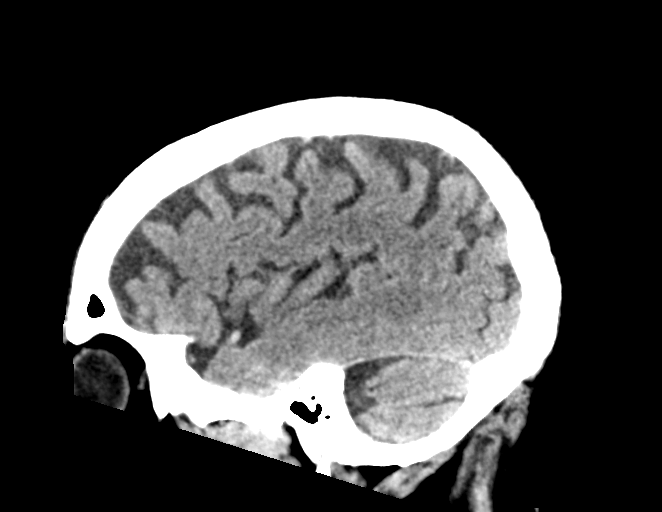

[15 of 47 positions shown; findings below may reference images not displayed]

FINDINGS: Brain: No midline shift, ventriculomegaly, mass effect, evidence of
mass lesion, intracranial hemorrhage or evidence of cortically based
acute infarction. Mild generalized cerebral volume loss. Small area
of chronic appearing cortical encephalomalacia at the left superior
perirolandic cortex on series 3, image 22. But otherwise gray-white
matter differentiation appears within normal limits for age.

Vascular: No suspicious intracranial vascular hyperdensity.

Skull: No fracture identified.

Sinuses/Orbits: Visualized paranasal sinuses and mastoids are clear.

Other: Deep left forehead scalp laceration appears to extend to the
outer periosteum on series 4, image 14. Associated bridge of nose
and other regional forehead soft tissue swelling and/or hematoma. No
tracking soft tissue gas. Sebaceous or dermal type cyst at the
vertex. Visible orbits soft tissues remain normal.
IMPRESSION: 1. Deep forehead scalp laceration which might extend to the
periosteum of the frontal bone. Bridge of nose and other regional
forehead soft tissue swelling. No underlying skull fracture
identified. See also Face CT.

2. No acute intracranial abnormality. Small chronic appearing left
superior perirolandic infarct.

## 2022-05-22 IMAGING — CT CT MAXILLOFACIAL W/O CM
3 series · 15 of 47 positions shown, 18 images · non-contrast
Comparison: Head and cervical spine CT today.

CLINICAL DATA: 75-year-old male status post fall at home.

EXAM:
CT MAXILLOFACIAL WITHOUT CONTRAST
TECHNIQUE: Multidetector CT imaging of the maxillofacial structures was
performed. Multiplanar CT image reconstructions were also generated.

[Series 3: facialbone 2.0 st · axial · 0.34mm/px · z∈[-186,-30]mm · 9 of 92 slices shown, 12 images]
[im 7/92  brain]
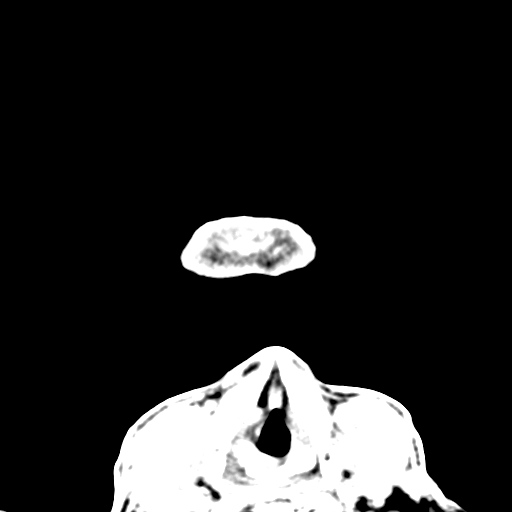
[im 7/92  bone]
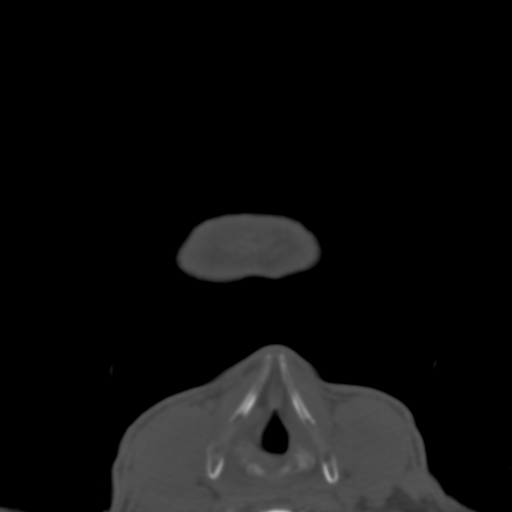
[im 16/92  bone]
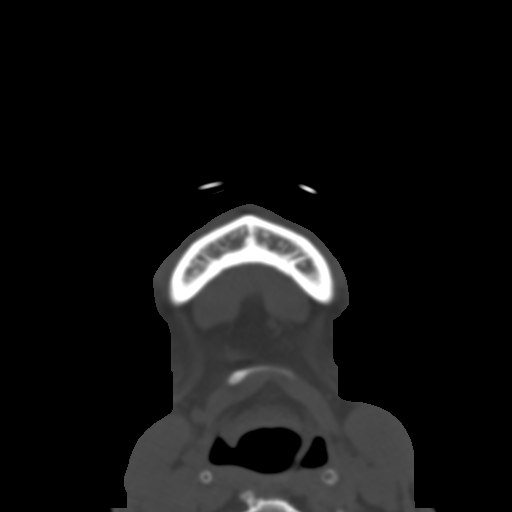
[im 26/92  bone]
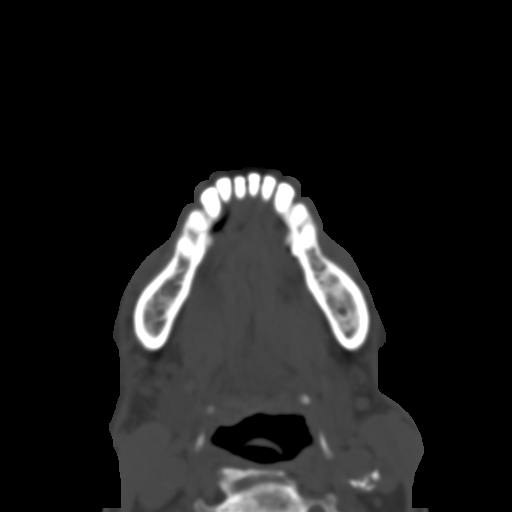
[im 35/92  bone]
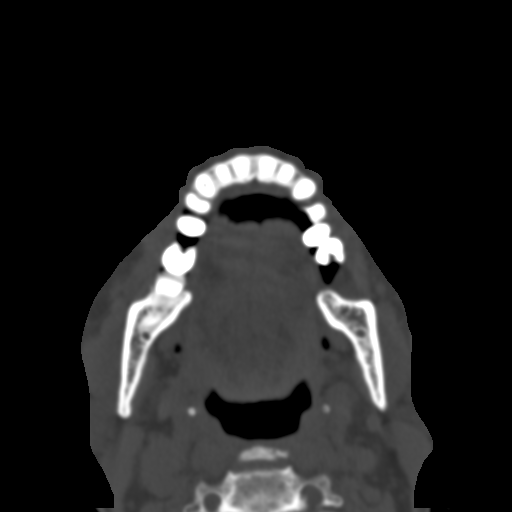
[im 48/92  brain]
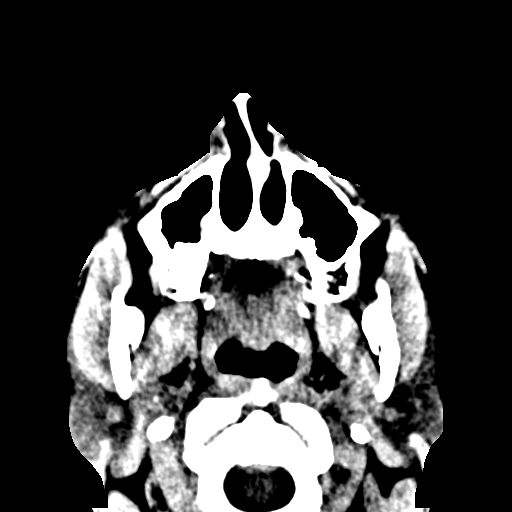
[im 48/92  bone]
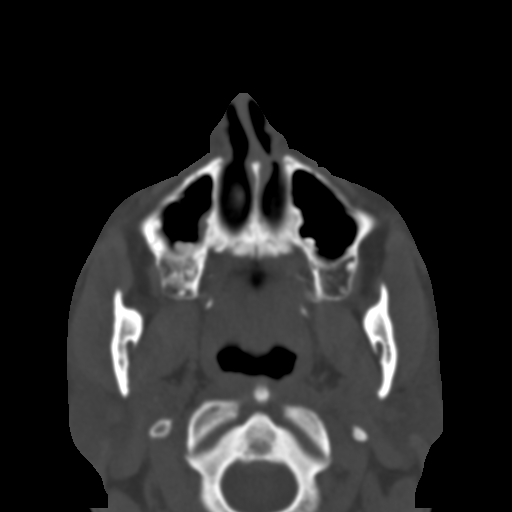
[im 57/92  bone]
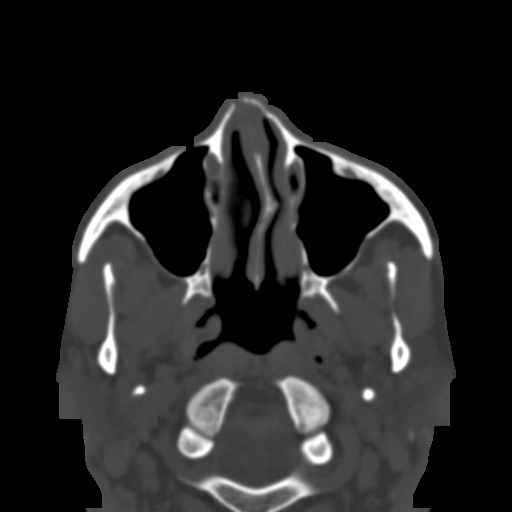
[im 66/92  bone]
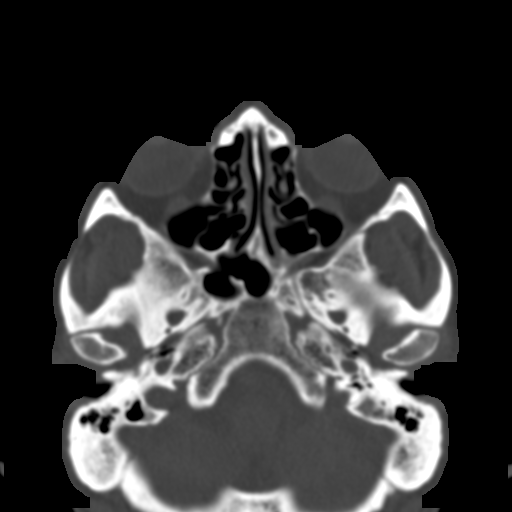
[im 76/92  bone]
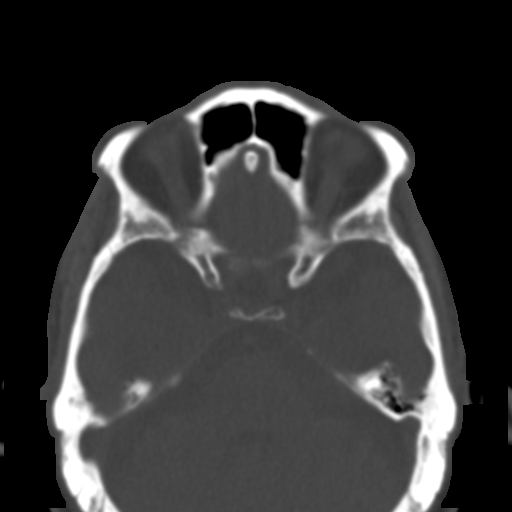
[im 85/92  brain]
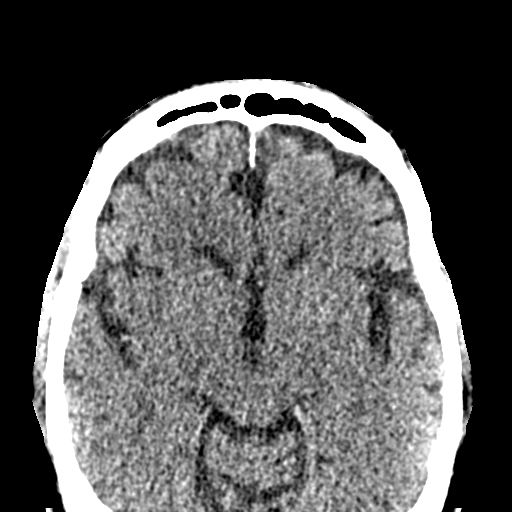
[im 85/92  bone]
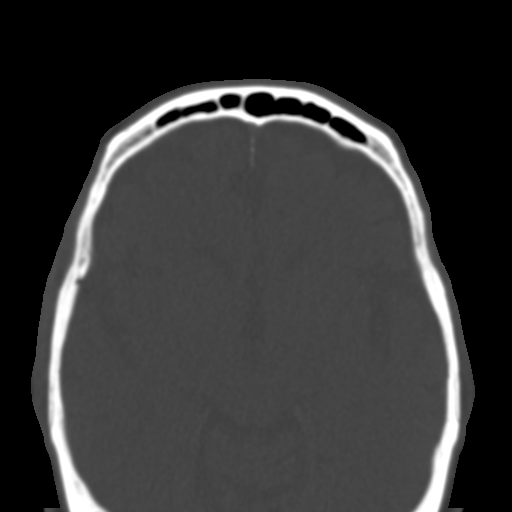

[Series 8: facialbone 2.0 cor st · coronal · 0.38mm/px · 3 of 95 slices shown]
[im 32/95  bone]
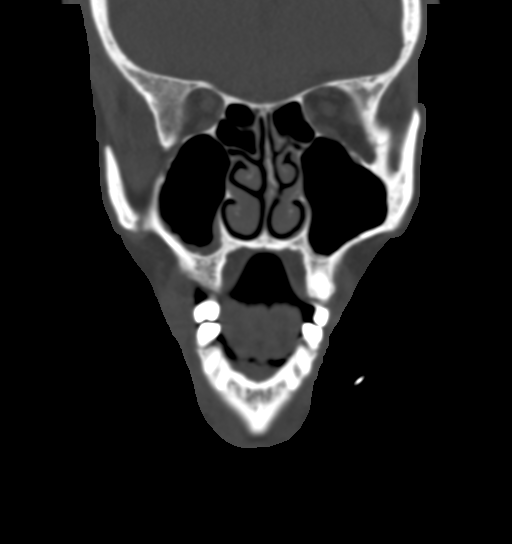
[im 42/95  bone]
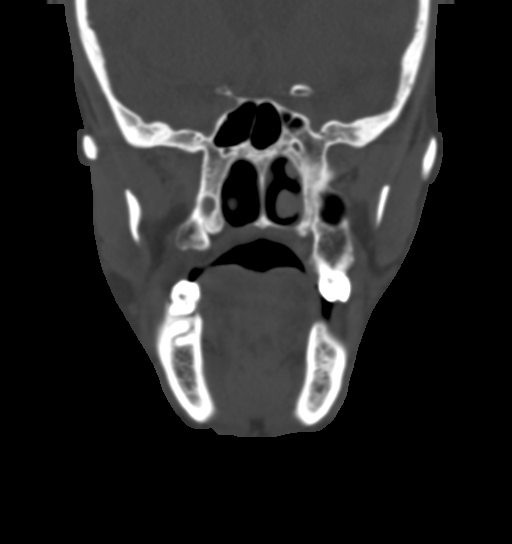
[im 53/95  bone]
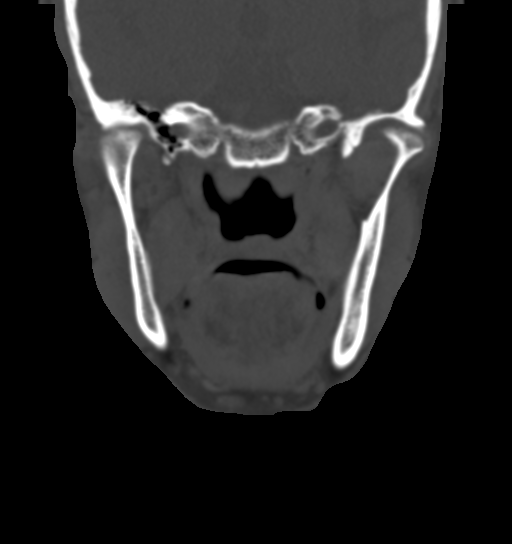

[Series 10: facialbone 2.0 sag st · sagittal · 0.37mm/px · 3 of 76 slices shown]
[im 26/76  bone]
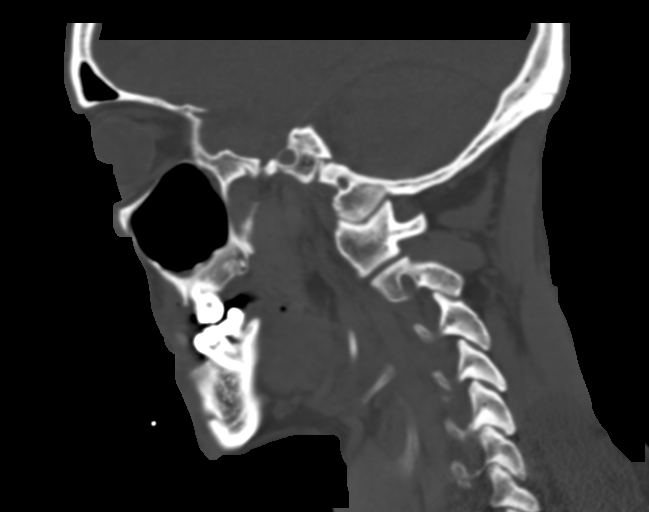
[im 38/76  bone]
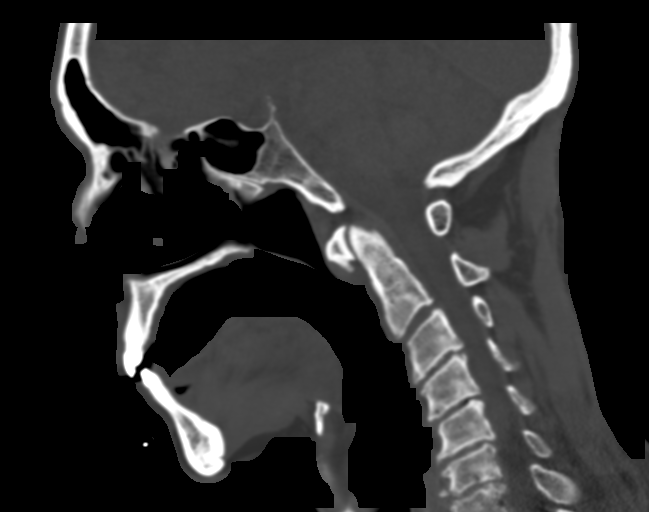
[im 51/76  bone]
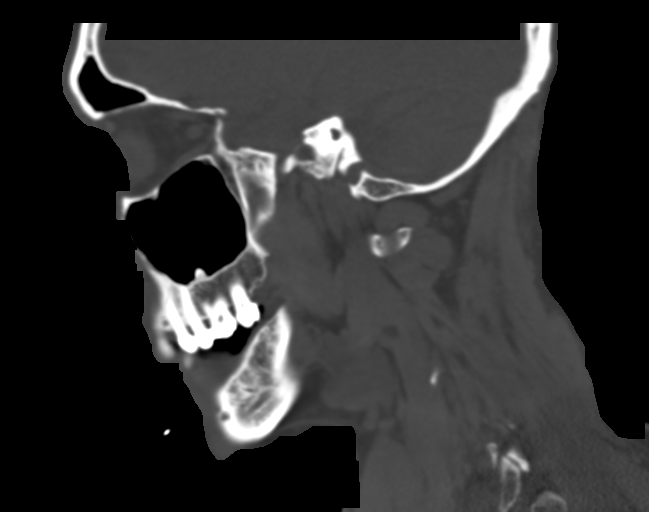

[15 of 47 positions shown; findings below may reference images not displayed]

FINDINGS: Osseous: Mandible intact and normally located. No maxilla, zygoma,
or pterygoid fractures. Comminuted and mildly displaced nasal bone
fracture mostly on the left. Overlying laceration and regional soft
tissue swelling. See series 4, image 58. Leftward nasal septal
deviation and spurring which is probably chronic. Central skull base
appears intact. Cervical spine reported separately.

Orbits: No orbital wall fracture. Globes and intraorbital soft
tissues appear normal.

Sinuses: Clear throughout.

Soft tissues: Superficial soft tissue injury at the bridge of the
nose and forehead. Elongated stylohyoid ligament calcification
bilaterally. Negative visible noncontrast deep soft tissue spaces of
the face aside from bulky left ICA origin calcified atherosclerosis.

Limited intracranial: Reported separately.
IMPRESSION: 1. Comminuted and mildly displaced nasal bone fractures mostly with
overlying laceration and soft tissue swelling.
2. No other facial fracture or acute traumatic injury identified in
the face.

## 2023-01-19 ENCOUNTER — Ambulatory Visit (INDEPENDENT_AMBULATORY_CARE_PROVIDER_SITE_OTHER): Payer: Medicare PPO | Admitting: Nurse Practitioner

## 2023-01-19 ENCOUNTER — Encounter: Payer: Self-pay | Admitting: Nurse Practitioner

## 2023-01-19 VITALS — BP 180/96 | HR 87 | Temp 98.8°F | Resp 16 | Ht 74.25 in | Wt 178.0 lb

## 2023-01-19 DIAGNOSIS — Z23 Encounter for immunization: Secondary | ICD-10-CM

## 2023-01-19 DIAGNOSIS — R350 Frequency of micturition: Secondary | ICD-10-CM | POA: Diagnosis not present

## 2023-01-19 DIAGNOSIS — Z125 Encounter for screening for malignant neoplasm of prostate: Secondary | ICD-10-CM

## 2023-01-19 DIAGNOSIS — I10 Essential (primary) hypertension: Secondary | ICD-10-CM | POA: Insufficient documentation

## 2023-01-19 DIAGNOSIS — R600 Localized edema: Secondary | ICD-10-CM | POA: Diagnosis not present

## 2023-01-19 LAB — POCT URINALYSIS DIPSTICK
Bilirubin, UA: NEGATIVE
Blood, UA: NEGATIVE
Glucose, UA: NEGATIVE
Ketones, UA: NEGATIVE
Leukocytes, UA: NEGATIVE
Nitrite, UA: NEGATIVE
Protein, UA: NEGATIVE
Spec Grav, UA: 1.02 (ref 1.010–1.025)
Urobilinogen, UA: 0.2 E.U./dL — AB
pH, UA: 5.5 (ref 5.0–8.0)

## 2023-01-19 MED ORDER — LOSARTAN POTASSIUM 25 MG PO TABS: 25.0000 mg | ORAL_TABLET | Freq: Every day | ORAL | 1 refills | Status: DC

## 2023-01-19 NOTE — Patient Instructions (Signed)
Nice to see you today I have sent in blood pressure medication. You will take 1 pill every day I want to see you in 1 month for a recheck I will be in touch with the labs once I have them

## 2023-01-19 NOTE — Assessment & Plan Note (Signed)
Patient states he has been experiencing some urinary frequency but no other urinary symptoms.  States he is having nocturia this is likely secondary to patient's prostate we will check a PSA today.  UA was negative in office

## 2023-01-19 NOTE — Progress Notes (Signed)
New Patient Office Visit  Subjective    Patient ID: Albert Mendoza, male    DOB: 19-Jul-1946  Age: 77 y.o. MRN: [id number]  CC:  Chief Complaint  Patient presents with   Establish Care    HPI Albert Mendoza presents to establish care  Knee swelling: States that it is his right knee. Has been intermittent for years. States that he will take tylenol as needed. States the tylenol will help. States that it has not been bothering him this week States that he has been having swelling on both legs. The left leg has been swollen over the past 2 days. States that it does help to keep it elevated. States that he goes to the bathroom often.  No history  of DVT, exogenous  hormones. Recent travel or surgery   Htn: Patient's blood pressure elevated today in office visit.  Was elevated approximately 5 years ago but not to this degree when he saw colleague.  Patient's blood pressure was also elevated in between both this office visit the emergency department.  He is here today complaining of bilateral lower extremity edema.  States he is never been diagnosed with blood pressure in the past.  States his son does have blood pressure that requires medication  Tdap;2022 Flu: up to date Covid: up to date PNA: up date today Shingles: Get at local pharmacy  Colonoscopy: none in life. Aged out PSA: Due   Outpatient Encounter Medications as of 01/19/2023  Medication Sig   losartan (COZAAR) 25 MG tablet Take 1 tablet (25 mg total) by mouth daily.   ibuprofen (ADVIL,MOTRIN) 200 MG tablet Take 200 mg by mouth as needed. (Patient not taking: Reported on 01/19/2023)   No facility-administered encounter medications on file as of 01/19/2023.    Past Medical History:  Diagnosis Date   Arthritis    Eating disorder     Past Surgical History:  Procedure Laterality Date   HERNIA REPAIR      Family History  Problem Relation Age of Onset   Diabetes Mother    Cancer Mother    Diabetes Father      Social History   Socioeconomic History   Marital status: Widowed    Spouse name: Not on file   Number of children: 3   Years of education: Not on file   Highest education level: Not on file  Occupational History   Not on file  Tobacco Use   Smoking status: Former    Packs/day: 1.00    Years: 4.00    Total pack years: 4.00    Types: Cigarettes    Quit date: 11/28/2002    Years since quitting: 20.1   Smokeless tobacco: Current    Types: Snuff  Vaping Use   Vaping Use: Never used  Substance and Sexual Activity   Alcohol use: Yes    Comment: pint a week   Drug use: Never   Sexual activity: Not Currently  Other Topics Concern   Not on file  Social History Narrative   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  Respiratory:  Negative for shortness of breath.   Cardiovascular:  Positive for leg swelling. Negative for chest pain.  Gastrointestinal:  Negative for abdominal pain, constipation, diarrhea, nausea and vomiting.       BM daily   Genitourinary:  Negative for dysuria and hematuria.  Neurological:  Negative for headaches.  Psychiatric/Behavioral:  Negative for hallucinations and suicidal ideas.         Objective    BP (!) 180/96   Pulse 87   Temp 98.8 F (37.1 C)   Resp 16   Ht 6' 2.25" (1.886 m)   Wt 178 lb (80.7 kg)   SpO2 99%   BMI 22.70 kg/m   Physical Exam Vitals and nursing note reviewed.  Constitutional:      Appearance: Normal appearance.  HENT:     Right Ear: Tympanic membrane, ear canal and external ear normal.     Left Ear: Tympanic membrane, ear  canal and external ear normal.     Mouth/Throat:     Mouth: Mucous membranes are moist.     Pharynx: Oropharynx is clear.  Eyes:     Extraocular Movements: Extraocular movements intact.     Pupils: Pupils are equal, round, and reactive to light.     Comments: Wears glasses  Cardiovascular:     Rate and Rhythm: Normal rate and regular rhythm.     Pulses:          Dorsalis pedis pulses are 2+ on the right side and 2+ on the left side.     Heart sounds: Normal heart sounds.  Pulmonary:     Effort: Pulmonary effort is normal.     Breath sounds: Normal breath sounds.  Musculoskeletal:     Right lower leg: 2+ Pitting Edema present.     Left lower leg: 1+ Pitting Edema present.     Comments: 40.5 left 38c m right  Non tender, non erythematous supple bilateral calves.  No warmth felt on palpation  Lymphadenopathy:     Cervical: No cervical adenopathy.  Neurological:     Mental Status: He is alert.         Assessment & Plan:   Problem List Items Addressed This Visit       Cardiovascular and Mediastinum   Primary hypertension - Primary    Patient had 3 documented events of elevated blood pressure today the most severe of all.  Upon recheck still elevated.  Given patient has lower extremity edema we will avoid amlodipine and start patient on losartan 25 mg.  Pending labs today follow-up 1 month      Relevant Medications   losartan (COZAAR) 25 MG tablet   Other Relevant Orders   CBC   Comprehensive metabolic panel   TSH     Other   Bilateral lower extremity edema    Concerning for a heart failure that could been secondary to uncontrolled high blood pressure.  Will check basic labs today inclusive of BNP.  Low risk for blood clot given his bilateral lower extremities.  Without having a history of blood clots, recent surgery, recent travel, exogenous hormone use.  Patient states he will go down with elevation      Relevant Orders   CBC   Brain natriuretic peptide    Comprehensive metabolic panel   Urinary frequency    Patient states he has been experiencing some urinary frequency but no other urinary symptoms.  States  he is having nocturia this is likely secondary to patient's prostate we will check a PSA today.  UA was negative in office      Relevant Orders   POCT urinalysis dipstick (Completed)   Other Visit Diagnoses     Need for pneumococcal 20-valent conjugate vaccination       Relevant Orders   Pneumococcal conjugate vaccine 20-valent (Prevnar 20) (Completed)   Screening for prostate cancer       Relevant Orders   PSA, Medicare       Return in about 4 weeks (around 02/16/2023) for BP recheck/ edema.   Romilda Garret, NP

## 2023-01-19 NOTE — Assessment & Plan Note (Signed)
Concerning for a heart failure that could been secondary to uncontrolled high blood pressure.  Will check basic labs today inclusive of BNP.  Low risk for blood clot given his bilateral lower extremities.  Without having a history of blood clots, recent surgery, recent travel, exogenous hormone use.  Patient states he will go down with elevation

## 2023-01-19 NOTE — Assessment & Plan Note (Signed)
Patient had 3 documented events of elevated blood pressure today the most severe of all.  Upon recheck still elevated.  Given patient has lower extremity edema we will avoid amlodipine and start patient on losartan 25 mg.  Pending labs today follow-up 1 month

## 2023-01-20 LAB — COMPREHENSIVE METABOLIC PANEL
ALT: 9 U/L (ref 0–53)
AST: 13 U/L (ref 0–37)
Albumin: 4.3 g/dL (ref 3.5–5.2)
Alkaline Phosphatase: 58 U/L (ref 39–117)
BUN: 11 mg/dL (ref 6–23)
CO2: 27 mEq/L (ref 19–32)
Calcium: 9.3 mg/dL (ref 8.4–10.5)
Chloride: 106 mEq/L (ref 96–112)
Creatinine, Ser: 1.14 mg/dL (ref 0.40–1.50)
GFR: 62.04 mL/min (ref >60.00)
Glucose, Bld: 84 mg/dL (ref 70–99)
Potassium: 4.1 mEq/L (ref 3.5–5.1)
Sodium: 141 mEq/L (ref 135–145)
Total Bilirubin: 0.6 mg/dL (ref 0.2–1.2)
Total Protein: 6.9 g/dL (ref 6.0–8.3)

## 2023-01-20 LAB — CBC
HCT: 37.6 % — ABNORMAL LOW (ref 39.0–52.0)
Hemoglobin: 12.3 g/dL — ABNORMAL LOW (ref 13.0–17.0)
MCHC: 32.8 g/dL (ref 30.0–36.0)
MCV: 90.3 fl (ref 78.0–100.0)
Platelets: 252 10*3/uL (ref 150.0–400.0)
RBC: 4.16 Mil/uL — ABNORMAL LOW (ref 4.22–5.81)
RDW: 17.3 % — ABNORMAL HIGH (ref 11.5–15.5)
WBC: 6 10*3/uL (ref 4.0–10.5)

## 2023-01-20 LAB — TSH: TSH: 2.85 u[IU]/mL (ref 0.35–5.50)

## 2023-01-20 LAB — BRAIN NATRIURETIC PEPTIDE: Pro B Natriuretic peptide (BNP): 171 pg/mL — ABNORMAL HIGH (ref 0.0–100.0)

## 2023-01-20 LAB — PSA, MEDICARE: PSA: 0.69 ng/ml (ref 0.10–4.00)

## 2023-01-23 ENCOUNTER — Telehealth: Payer: Self-pay | Admitting: Nurse Practitioner

## 2023-01-23 DIAGNOSIS — R600 Localized edema: Secondary | ICD-10-CM

## 2023-01-23 MED ORDER — FUROSEMIDE 20 MG PO TABS: 20.0000 mg | ORAL_TABLET | Freq: Every day | ORAL | 0 refills | Status: DC

## 2023-01-23 NOTE — Telephone Encounter (Signed)
-----   Message from Barkley Bruns, [place] sent at 01/23/2023 12:22 PM EST ----- Pt ok with taking the fluid pill lab appointment made for next week.

## 2023-01-23 NOTE — Telephone Encounter (Signed)
Called and spoke to pt about lab results.

## 2023-01-23 NOTE — Telephone Encounter (Signed)
Medication sent in and lab orders placed

## 2023-01-23 NOTE — Telephone Encounter (Signed)
Pt returning call regarding lab results [phone]

## 2023-01-31 ENCOUNTER — Other Ambulatory Visit (INDEPENDENT_AMBULATORY_CARE_PROVIDER_SITE_OTHER): Payer: Medicare PPO

## 2023-01-31 ENCOUNTER — Encounter: Payer: Self-pay | Admitting: Pharmacist

## 2023-01-31 DIAGNOSIS — R600 Localized edema: Secondary | ICD-10-CM | POA: Diagnosis not present

## 2023-01-31 LAB — BASIC METABOLIC PANEL
BUN: 20 mg/dL (ref 6–23)
CO2: 28 mEq/L (ref 19–32)
Calcium: 9.5 mg/dL (ref 8.4–10.5)
Chloride: 101 mEq/L (ref 96–112)
Creatinine, Ser: 1.21 mg/dL (ref 0.40–1.50)
GFR: 57.75 mL/min — ABNORMAL LOW (ref >60.00)
Glucose, Bld: 93 mg/dL (ref 70–99)
Potassium: 3.9 mEq/L (ref 3.5–5.1)
Sodium: 138 mEq/L (ref 135–145)

## 2023-01-31 LAB — BRAIN NATRIURETIC PEPTIDE: Pro B Natriuretic peptide (BNP): 34 pg/mL (ref 0.0–100.0)

## 2023-02-02 ENCOUNTER — Telehealth: Payer: Self-pay | Admitting: Nurse Practitioner

## 2023-02-02 DIAGNOSIS — R600 Localized edema: Secondary | ICD-10-CM

## 2023-02-02 MED ORDER — FUROSEMIDE 20 MG PO TABS: 20.0000 mg | ORAL_TABLET | Freq: Every day | ORAL | 0 refills | Status: DC | PRN

## 2023-02-02 NOTE — Telephone Encounter (Signed)
I will send more in that he can use as needed. He needs to keep his appointment with me as scheduled

## 2023-02-02 NOTE — Telephone Encounter (Signed)
-----   Message from [place], [place] sent at 02/02/2023 11:25 AM EST ----- Called and he stated that's there is a little swelling in left not much and he could not really tell when he tired to do indention on his leg.  He wanted to know if he can get more fluid pills?

## 2023-02-03 NOTE — Telephone Encounter (Signed)
Called pt and informed him of this and he said that he would keep his appointment to see [facility].

## 2023-02-16 ENCOUNTER — Ambulatory Visit (INDEPENDENT_AMBULATORY_CARE_PROVIDER_SITE_OTHER): Payer: Medicare PPO | Admitting: Nurse Practitioner

## 2023-02-16 ENCOUNTER — Encounter: Payer: Self-pay | Admitting: Nurse Practitioner

## 2023-02-16 VITALS — BP 132/70 | HR 52 | Temp 98.4°F | Resp 16 | Ht 74.25 in | Wt 172.4 lb

## 2023-02-16 DIAGNOSIS — I10 Essential (primary) hypertension: Secondary | ICD-10-CM

## 2023-02-16 DIAGNOSIS — R600 Localized edema: Secondary | ICD-10-CM | POA: Diagnosis not present

## 2023-02-16 DIAGNOSIS — R7989 Other specified abnormal findings of blood chemistry: Secondary | ICD-10-CM | POA: Insufficient documentation

## 2023-02-16 LAB — BASIC METABOLIC PANEL
BUN: 18 mg/dL (ref 6–23)
CO2: 27 mEq/L (ref 19–32)
Calcium: 9.2 mg/dL (ref 8.4–10.5)
Chloride: 103 mEq/L (ref 96–112)
Creatinine, Ser: 1.22 mg/dL (ref 0.40–1.50)
GFR: 57.16 mL/min — ABNORMAL LOW (ref >60.00)
Glucose, Bld: 107 mg/dL — ABNORMAL HIGH (ref 70–99)
Potassium: 4.2 mEq/L (ref 3.5–5.1)
Sodium: 137 mEq/L (ref 135–145)

## 2023-02-16 MED ORDER — LOSARTAN POTASSIUM 25 MG PO TABS: 25.0000 mg | ORAL_TABLET | Freq: Every day | ORAL | 1 refills | Status: DC

## 2023-02-16 NOTE — Assessment & Plan Note (Signed)
Patient is Lasix 20 mg daily for a week and has been using it as needed afterwards.  Much improved lower extremity edema today.  Did have discussion with patient given elevated BNP and blood pressure not being treated for extended period time will obtain echocardiogram to make sure no systolic dysfunction or cardiomegaly/hypertrophy.  Order placed for echocardiogram today

## 2023-02-16 NOTE — Patient Instructions (Signed)
Nice to see you today You will get a call to schedule the Ultrasound of the heart (ECHO) Follow up with me in 6 months, sooner if you need me

## 2023-02-16 NOTE — Progress Notes (Signed)
Established Patient Office Visit  Subjective   Patient ID: Albert Mendoza, male    DOB: 17-Dec-1945  Age: 77 y.o. MRN: [id number]  Chief Complaint  Patient presents with   Hypertension   Edema      HTN: Patient was seen by me on 01/19/2023.  He was started on losartan 25 mg for blood pressure control and labs were drawn.  Patient is here for follow-up on blood pressure. States  Edema: Patient was seen on 01/19/2023 labs drawn with positive BNP and lower extremity edema.  Patient was written a dose of furosemide to help with edema quite a bit with repeat BMP and BNP looking good we did continue.  Patient is here for follow-up. States that he will take it as needed.     Review of Systems  Constitutional:  Negative for chills and fever.  Respiratory:  Negative for shortness of breath.   Cardiovascular:  Positive for leg swelling (improved). Negative for chest pain.  Neurological:  Negative for dizziness and headaches.      Objective:     BP 132/70   Pulse (!) 52   Temp 98.4 F (36.9 C)   Resp 16   Ht 6' 2.25" (1.886 m)   Wt 172 lb 6 oz (78.2 kg)   SpO2 97%   BMI 21.98 kg/m  BP Readings from Last 3 Encounters:  02/16/23 132/70  01/19/23 (!) 180/96  09/20/21 (!) 153/91   Wt Readings from Last 3 Encounters:  02/16/23 172 lb 6 oz (78.2 kg)  01/19/23 178 lb (80.7 kg)  08/31/15 176 lb (79.8 kg)      Physical Exam Vitals and nursing note reviewed.  Constitutional:      Appearance: Normal appearance.  Cardiovascular:     Rate and Rhythm: Normal rate and regular rhythm.     Heart sounds: Normal heart sounds.  Pulmonary:     Effort: Pulmonary effort is normal.     Breath sounds: Normal breath sounds.  Musculoskeletal:     Right lower leg: No edema.     Left lower leg: Edema (1+ pitting edema) present.  Neurological:     Mental Status: He is alert.      No results found for any visits on 02/16/23.    The ASCVD Risk score (Arnett DK, et al., 2019) failed to  calculate for the following reasons:   Cannot find a previous HDL lab   Cannot find a previous total cholesterol lab    Assessment & Plan:   Problem List Items Addressed This Visit       Cardiovascular and Mediastinum   Primary hypertension    Patient tolerating losartan 25 mg daily well.  No reports of dizziness or lightheadedness.  Blood pressure within normal limits.  Recheck BMP today.  Continue medication as prescribed refill provided      Relevant Medications   losartan (COZAAR) 25 MG tablet   Other Relevant Orders   ECHOCARDIOGRAM COMPLETE   Basic metabolic panel     Other   Bilateral lower extremity edema - Primary    Patient is Lasix 20 mg daily for a week and has been using it as needed afterwards.  Much improved lower extremity edema today.  Did have discussion with patient given elevated BNP and blood pressure not being treated for extended period time will obtain echocardiogram to make sure no systolic dysfunction or cardiomegaly/hypertrophy.  Order placed for echocardiogram today      Relevant Orders   ECHOCARDIOGRAM COMPLETE  Basic metabolic panel   Elevated brain natriuretic peptide (BNP) level    After 1 week of Lasix 20 mg daily BMP came back to normal limits.  Given elevated BNP and blood pressure will obtain echocardiogram.  Order placed today.      Relevant Orders   ECHOCARDIOGRAM COMPLETE    Return in about 6 months (around 08/19/2023) for BP recheck.    Romilda Garret, NP

## 2023-02-16 NOTE — Assessment & Plan Note (Signed)
After 1 week of Lasix 20 mg daily BMP came back to normal limits.  Given elevated BNP and blood pressure will obtain echocardiogram.  Order placed today.

## 2023-02-16 NOTE — Assessment & Plan Note (Signed)
Patient tolerating losartan 25 mg daily well.  No reports of dizziness or lightheadedness.  Blood pressure within normal limits.  Recheck BMP today.  Continue medication as prescribed refill provided

## 2023-02-25 ENCOUNTER — Other Ambulatory Visit: Payer: Self-pay | Admitting: Nurse Practitioner

## 2023-02-25 DIAGNOSIS — R600 Localized edema: Secondary | ICD-10-CM

## 2023-03-04 DIAGNOSIS — H524 Presbyopia: Secondary | ICD-10-CM | POA: Diagnosis not present

## 2023-07-13 ENCOUNTER — Encounter (INDEPENDENT_AMBULATORY_CARE_PROVIDER_SITE_OTHER): Payer: Self-pay

## 2023-08-03 ENCOUNTER — Ambulatory Visit (INDEPENDENT_AMBULATORY_CARE_PROVIDER_SITE_OTHER): Payer: Medicare Other

## 2023-08-03 VITALS — BP 138/80 | HR 87 | Ht 74.25 in | Wt 176.8 lb

## 2023-08-03 DIAGNOSIS — Z0000 Encounter for general adult medical examination without abnormal findings: Secondary | ICD-10-CM

## 2023-08-03 NOTE — Addendum Note (Signed)
Addended by: Maryan Puls on: 08/03/2023 01:56 PM   Modules accepted: Level of Service

## 2023-08-03 NOTE — Progress Notes (Signed)
Subjective:   Albert Mendoza is a 77 y.o. male who presents for Medicare Annual/Subsequent preventive examination.  Visit Complete: In person   Review of Systems      Cardiac Risk Factors include: advanced age (>38men, >67 women);hypertension;male gender     Objective:    Today's Vitals   08/03/23 1325  BP: 138/80  Pulse: 87  SpO2: 97%  Weight: 176 lb 12.8 oz (80.2 kg)  Height: 6' 2.25" (1.886 m)   Body mass index is 22.55 kg/m.     08/03/2023    1:34 PM  Advanced Directives  Does Patient Have a Medical Advance Directive? Yes  Type of Estate agent of [place];Living will  Copy of Healthcare Power of Attorney in Chart? No - copy requested    Current Medications (verified) Outpatient Encounter Medications as of 08/03/2023  Medication Sig   furosemide (LASIX) 20 MG tablet TAKE 1 TABLET BY MOUTH EVERY DAY AS NEEDED   losartan (COZAAR) 25 MG tablet Take 1 tablet (25 mg total) by mouth daily.   ibuprofen (ADVIL,MOTRIN) 200 MG tablet Take 200 mg by mouth as needed. (Patient not taking: Reported on 01/19/2023)   No facility-administered encounter medications on file as of 08/03/2023.    Allergies (verified) Patient has no known allergies.   History: Past Medical History:  Diagnosis Date   Arthritis    Eating disorder    Past Surgical History:  Procedure Laterality Date   HERNIA REPAIR     Family History  Problem Relation Age of Onset   Diabetes Mother    Cancer Mother    Diabetes Father    Social History   Socioeconomic History   Marital status: Widowed    Spouse name: Not on file   Number of children: 3   Years of education: Not on file   Highest education level: Not on file  Occupational History   Not on file  Tobacco Use   Smoking status: Former    Current packs/day: 0.00    Average packs/day: 1 pack/day for 4.0 years (4.0 ttl pk-yrs)    Types: Cigarettes    Start date: 11/28/1998    Quit date: 11/28/2002    Years since  quitting: 20.6   Smokeless tobacco: Current    Types: Snuff  Vaping Use   Vaping status: Never Used  Substance and Sexual Activity   Alcohol use: Yes    Comment: pint a week   Drug use: Never   Sexual activity: Not Currently  Other Topics Concern   Not on file  Social History Narrative   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54)   [place] ( 51)   Brenda (53)      Hobbies: hunting and fishin    Social Determinants of Health   Financial Resource Strain: Low Risk  (08/03/2023)   Overall Financial Resource Strain (CARDIA)    Difficulty of Paying Living Expenses: Not hard at all  Food Insecurity: No Food Insecurity (08/03/2023)   Hunger Vital Sign    Worried About Running Out of Food in the Last Year: Never true    Ran Out of Food in the Last Year: Never true  Transportation Needs: No Transportation Needs (08/03/2023)   PRAPARE - Administrator, Civil Service (Medical): No    Lack of Transportation (Non-Medical): No  Physical Activity: Insufficiently Active (08/03/2023)   Exercise Vital Sign    Days of Exercise per Week: 7 days    Minutes of Exercise  per Session: 20 min  Stress: No Stress Concern Present (08/03/2023)   [employer] of Occupational Health - Occupational Stress Questionnaire    Feeling of Stress : Not at all  Social Connections: Socially Isolated (08/03/2023)   Social Connection and Isolation Panel [NHANES]    Frequency of Communication with Friends and Family: More than three times a week    Frequency of Social Gatherings with Friends and Family: More than three times a week    Attends Religious Services: Never    Database administrator or Organizations: No    Attends Banker Meetings: Never    Marital Status: Widowed    Tobacco Counseling Ready to quit: Not Answered Counseling given: Not Answered   Clinical Intake:  Pre-visit preparation completed: Yes  Pain : No/denies pain     BMI - recorded: 22.55 Nutritional Status: BMI of 19-24   Normal Nutritional Risks: None Diabetes: No  How often do you need to have someone help you when you read instructions, pamphlets, or other written materials from your doctor or pharmacy?: 1 - Never  Interpreter Needed?: No  Information entered by :: C.Smayan Hackbart LPN   Activities of Daily Living    08/03/2023    1:36 PM  In your present state of health, do you have any difficulty performing the following activities:  Hearing? 0  Vision? 0  Difficulty concentrating or making decisions? 0  Walking or climbing stairs? 0  Dressing or bathing? 0  Doing errands, shopping? 0  Preparing Food and eating ? N  Using the Toilet? N  In the past six months, have you accidently leaked urine? N  Do you have problems with loss of bowel control? N  Managing your Medications? N  Managing your Finances? N  Housekeeping or managing your Housekeeping? N    Patient Care Team: Eden Emms, NP as PCP - General (Nurse Practitioner)  Indicate any recent Medical Services you may have received from other than [facility] providers in the past year (date may be approximate).     Assessment:   This is a routine wellness examination for Albert Mendoza.  Hearing/Vision screen Hearing Screening - Comments:: Denies hearing difficulties   Vision Screening - Comments:: Glasses - [facility] - UTD on eye exams   Goals Addressed             This Visit's Progress    Patient Stated       Increase exercise       Depression Screen    08/03/2023    1:31 PM 02/16/2023   10:22 AM 01/19/2023    2:09 PM 08/31/2015    9:20 AM  PHQ 2/9 Scores  PHQ - 2 Score 0 0 0 0  PHQ- 9 Score  0 0     Fall Risk    08/03/2023    1:33 PM 02/16/2023   10:22 AM 01/19/2023    2:05 PM  Fall Risk   Falls in the past year? 0 0 1  Number falls in past yr: 0 0 0  Injury with Fall? 0 0 1  Risk for fall due to : No Fall Risks No Fall Risks   Follow up Falls prevention discussed;Falls evaluation completed Falls evaluation completed Falls  evaluation completed    MEDICARE RISK AT HOME: Medicare Risk at Home Any stairs in or around the home?: Yes If so, are there any without handrails?: No Home free of loose throw rugs in walkways, pet beds, electrical cords, etc?:  Yes Adequate lighting in your home to reduce risk of falls?: Yes Life alert?: No Use of a cane, walker or w/c?: No Grab bars in the bathroom?: Yes Shower chair or bench in shower?: Yes Elevated toilet seat or a handicapped toilet?: No  TIMED UP AND GO:  Was the test performed?  No    Cognitive Function:        08/03/2023    1:37 PM  6CIT Screen  What Year? 0 points  What month? 0 points  What time? 0 points  Count back from 20 0 points  Months in reverse 0 points  Repeat phrase 6 points  Total Score 6 points    Immunizations Immunization History  Administered Date(s) Administered   PNEUMOCOCCAL CONJUGATE-20 01/19/2023   Tdap 09/20/2021    TDAP status: Up to date  Flu Vaccine status: Due, Education has been provided regarding the importance of this vaccine. Advised may receive this vaccine at local pharmacy or Health Dept. Aware to provide a copy of the vaccination record if obtained from local pharmacy or Health Dept. Verbalized acceptance and understanding.  Pneumococcal vaccine status: Up to date  Covid-19 vaccine status: Information provided on how to obtain vaccines.   Qualifies for Shingles Vaccine? Yes   Zostavax completed No   Shingrix Completed?: No.    Education has been provided regarding the importance of this vaccine. Patient has been advised to call insurance company to determine out of pocket expense if they have not yet received this vaccine. Advised may also receive vaccine at local pharmacy or Health Dept. Verbalized acceptance and understanding.  Screening Tests Health Maintenance  Topic Date Due   Hepatitis C Screening  Never done   Zoster Vaccines- Shingrix (1 of 2) Never done   INFLUENZA VACCINE  Never done    COVID-19 Vaccine (1 - 2023-24 season) Never done   Medicare Annual Wellness (AWV)  08/02/2024   DTaP/Tdap/Td (2 - Td or Tdap) 09/21/2031   Pneumonia Vaccine 5+ Years old  Completed   HPV VACCINES  Aged Out    Health Maintenance  Health Maintenance Due  Topic Date Due   Hepatitis C Screening  Never done   Zoster Vaccines- Shingrix (1 of 2) Never done   INFLUENZA VACCINE  Never done   COVID-19 Vaccine (1 - 2023-24 season) Never done    Colorectal cancer screening: No longer required.   Lung Cancer Screening: (Low Dose CT Chest recommended if Age 21-80 years, 20 pack-year currently smoking OR have quit w/in 15years.) does not qualify.    Lung Cancer Screening Referral:    Additional Screening:  Hepatitis C Screening: does qualify; Completed DUE  Vision Screening: Recommended annual ophthalmology exams for early detection of glaucoma and other disorders of the eye. Is the patient up to date with their annual eye exam?  Yes  Who is the provider or what is the name of the office in which the patient attends annual eye exams? [facility] If pt is not established with a provider, would they like to be referred to a provider to establish care? Yes .   Dental Screening: Recommended annual dental exams for proper oral hygiene  Diabetic Foot Exam:   Community Resource Referral / Chronic Care Management: CRR required this visit?  No   CCM required this visit?  No     Plan:     I have personally reviewed and noted the following in the patient's chart:   Medical and social history Use of alcohol,  tobacco or illicit drugs  Current medications and supplements including opioid prescriptions. Patient is not currently taking opioid prescriptions. Functional ability and status Nutritional status Physical activity Advanced directives List of other physicians Hospitalizations, surgeries, and ER visits in previous 12 months Vitals Screenings to include cognitive, depression, and  falls Referrals and appointments  In addition, I have reviewed and discussed with patient certain preventive protocols, quality metrics, and best practice recommendations. A written personalized care plan for preventive services as well as general preventive health recommendations were provided to patient.     Maryan Puls, LPN   [id number]   After Visit Summary: Declined AVS available in [facility]  Nurse Notes: none

## 2023-08-03 NOTE — Patient Instructions (Signed)
Albert Mendoza , Thank you for taking time to come for your Medicare Wellness Visit. I appreciate your ongoing commitment to your health goals. Please review the following plan we discussed and let me know if I can assist you in the future.   Referrals/Orders/Follow-Ups/Clinician Recommendations: Aim for 30 minutes of exercise or brisk walking, 6-8 glasses of water, and 5 servings of fruits and vegetables each day.   This is a list of the screening recommended for you and due dates:  Health Maintenance  Topic Date Due   Hepatitis C Screening  Never done   Zoster (Shingles) Vaccine (1 of 2) Never done   Medicare Annual Wellness Visit  12/22/2016   Flu Shot  Never done   COVID-19 Vaccine (1 - 2023-24 season) Never done   DTaP/Tdap/Td vaccine (2 - Td or Tdap) 09/21/2031   Pneumonia Vaccine  Completed   HPV Vaccine  Aged Out    Advanced directives: (Copy Requested) Please bring a copy of your health care power of attorney and living will to the office to be added to your chart at your convenience.  Next Medicare Annual Wellness Visit scheduled for next year: Yes

## 2023-08-18 ENCOUNTER — Other Ambulatory Visit: Payer: Self-pay | Admitting: Nurse Practitioner

## 2023-08-18 DIAGNOSIS — I10 Essential (primary) hypertension: Secondary | ICD-10-CM

## 2023-08-21 NOTE — Telephone Encounter (Signed)
I had wanted to see the patient in 6 months from last office visit with me. Can we schedule him within the next 30 days for an office visit please

## 2023-08-21 NOTE — Telephone Encounter (Signed)
Patient scheduled.

## 2023-08-31 ENCOUNTER — Ambulatory Visit: Payer: Medicare Other | Admitting: Nurse Practitioner

## 2023-08-31 ENCOUNTER — Encounter: Payer: Self-pay | Admitting: Nurse Practitioner

## 2023-08-31 ENCOUNTER — Ambulatory Visit (INDEPENDENT_AMBULATORY_CARE_PROVIDER_SITE_OTHER)
Admission: RE | Admit: 2023-08-31 | Discharge: 2023-08-31 | Disposition: A | Discharge status: Home / Self Care | Payer: Medicare Other | Source: Ambulatory Visit | Attending: Nurse Practitioner | Admitting: Nurse Practitioner

## 2023-08-31 VITALS — BP 124/88 | HR 87 | Temp 97.8°F | Ht 74.25 in | Wt 170.0 lb

## 2023-08-31 DIAGNOSIS — Z23 Encounter for immunization: Secondary | ICD-10-CM

## 2023-08-31 DIAGNOSIS — M25521 Pain in right elbow: Secondary | ICD-10-CM | POA: Diagnosis not present

## 2023-08-31 DIAGNOSIS — I10 Essential (primary) hypertension: Secondary | ICD-10-CM

## 2023-08-31 DIAGNOSIS — M25421 Effusion, right elbow: Secondary | ICD-10-CM | POA: Diagnosis not present

## 2023-08-31 LAB — CBC
HCT: 45 % (ref 39.0–52.0)
Hemoglobin: 14.5 g/dL (ref 13.0–17.0)
MCHC: 32.3 g/dL (ref 30.0–36.0)
MCV: 91.5 fL (ref 78.0–100.0)
Platelets: 261 10*3/uL (ref 150.0–400.0)
RBC: 4.91 Mil/uL (ref 4.22–5.81)
RDW: 14.1 % (ref 11.5–15.5)
WBC: 5 10*3/uL (ref 4.0–10.5)

## 2023-08-31 LAB — BASIC METABOLIC PANEL
BUN: 17 mg/dL (ref 6–23)
CO2: 29 meq/L (ref 19–32)
Calcium: 9.4 mg/dL (ref 8.4–10.5)
Chloride: 103 meq/L (ref 96–112)
Creatinine, Ser: 1.21 mg/dL (ref 0.40–1.50)
GFR: 57.51 mL/min — ABNORMAL LOW (ref >60.00)
Glucose, Bld: 93 mg/dL (ref 70–99)
Potassium: 4.1 meq/L (ref 3.5–5.1)
Sodium: 138 meq/L (ref 135–145)

## 2023-08-31 LAB — URIC ACID: Uric Acid, Serum: 8 mg/dL — ABNORMAL HIGH (ref 4.0–7.8)

## 2023-08-31 NOTE — Assessment & Plan Note (Signed)
Without injury.  Query gout versus epicondylitis.  Will check uric acid pending elbow x-ray.  Patient can use Tylenol as directed on the bottle and Voltaren gel 4 times daily as needed

## 2023-08-31 NOTE — Assessment & Plan Note (Signed)
Patient currently maintained on losartan and furosemide as needed.  Blood pressure controlled upon recheck.  Continue medications prescribed

## 2023-08-31 NOTE — Progress Notes (Signed)
Established Patient Office Visit  Subjective   Patient ID: Albert Mendoza, male    DOB: [birth year]  Age: 77 y.o. MRN: [id number]  Chief Complaint  Patient presents with   Follow-up    Blood pressure. Yes to flu shot.     HPI  HTN: patient is currenlty on losartan 25mg  daily and will take lasix as needed. States that she took one yesterday. States that he does not check his blood pressure at home.  Elbow pain: state that it is the left arm. States that it started on Sunday. Staets that when he wok up he noticed it. No injury. States that it was swollen. Stats that when he moves it. States it is described as a tight. States tylenol does help   Colonscopy: aged out  PSA: 12/2022      Review of Systems  Constitutional:  Negative for chills and fever.  Respiratory:  Negative for shortness of breath.   Cardiovascular:  Positive for leg swelling. Negative for chest pain.  Neurological:  Negative for dizziness and headaches.      Objective:     BP 124/88   Pulse 87   Temp 97.8 F (36.6 C) (Oral)   Ht 6' 2.25" (1.886 m)   Wt 170 lb (77.1 kg)   SpO2 97%   BMI 21.68 kg/m  BP Readings from Last 3 Encounters:  08/31/23 124/88  08/03/23 138/80  02/16/23 132/70   Wt Readings from Last 3 Encounters:  08/31/23 170 lb (77.1 kg)  08/03/23 176 lb 12.8 oz (80.2 kg)  02/16/23 172 lb 6 oz (78.2 kg)      Physical Exam Vitals and nursing note reviewed.  Constitutional:      Appearance: Normal appearance.  Cardiovascular:     Rate and Rhythm: Normal rate and regular rhythm.     Heart sounds: Normal heart sounds.  Pulmonary:     Effort: Pulmonary effort is normal.     Breath sounds: Normal breath sounds.  Musculoskeletal:        General: No signs of injury.     Right elbow: Swelling present. No deformity. Normal range of motion. Tenderness present in medial epicondyle and lateral epicondyle.  Neurological:     Mental Status: He is alert.      No results found for  any visits on 08/31/23.    The ASCVD Risk score (Arnett DK, et al., 2019) failed to calculate for the following reasons:   Cannot find a previous HDL lab   Cannot find a previous total cholesterol lab    Assessment & Plan:   Problem List Items Addressed This Visit       Cardiovascular and Mediastinum   Primary hypertension - Primary    Patient currently maintained on losartan and furosemide as needed.  Blood pressure controlled upon recheck.  Continue medications prescribed      Relevant Orders   CBC   Basic metabolic panel     Other   Right elbow pain    Without injury.  Query gout versus epicondylitis.  Will check uric acid pending elbow x-ray.  Patient can use Tylenol as directed on the bottle and Voltaren gel 4 times daily as needed      Relevant Orders   Uric acid   DG Elbow Complete Right   Other Visit Diagnoses     Need for influenza vaccination       Relevant Orders   Flu Vaccine Trivalent High Dose (Fluad) (Completed)  Return in about 6 months (around 02/29/2024) for CPE and Labs.    Audria Nine, NP

## 2023-08-31 NOTE — Patient Instructions (Signed)
Nice to see you today I will be in touch with the labs and xray result once I have them Follow up with me in 6 months for a physical and full panel of labs We did update your flu vaccine today   It is ok to take tylenol if you need it and you can get some Voltaren Gel 1% at the store and you can use it 4 times a day as needed

## 2023-09-04 DIAGNOSIS — H2513 Age-related nuclear cataract, bilateral: Secondary | ICD-10-CM | POA: Diagnosis not present

## 2023-09-05 ENCOUNTER — Other Ambulatory Visit: Payer: Self-pay | Admitting: Nurse Practitioner

## 2023-09-05 DIAGNOSIS — M10021 Idiopathic gout, right elbow: Secondary | ICD-10-CM

## 2023-09-05 MED ORDER — PREDNISONE 20 MG PO TABS
ORAL_TABLET | ORAL | 0 refills | Status: AC
Start: 2023-09-05 — End: 2023-09-11

## 2023-09-22 ENCOUNTER — Other Ambulatory Visit: Payer: Self-pay | Admitting: Nurse Practitioner

## 2023-09-22 DIAGNOSIS — I10 Essential (primary) hypertension: Secondary | ICD-10-CM

## 2024-03-16 ENCOUNTER — Other Ambulatory Visit: Payer: Self-pay | Admitting: Nurse Practitioner

## 2024-03-16 DIAGNOSIS — I10 Essential (primary) hypertension: Secondary | ICD-10-CM

## 2024-03-18 NOTE — Telephone Encounter (Signed)
 Can we get patient scheduled for CPE within the next 30 days please

## 2024-03-18 NOTE — Telephone Encounter (Signed)
 Sent Mychart and lvmtcb

## 2024-03-20 NOTE — Telephone Encounter (Signed)
 Lvmtcb

## 2024-03-21 NOTE — Telephone Encounter (Signed)
 Called pt and schedule appt for cpe/labs

## 2024-06-14 ENCOUNTER — Other Ambulatory Visit: Payer: Self-pay | Admitting: Nurse Practitioner

## 2024-06-14 DIAGNOSIS — I10 Essential (primary) hypertension: Secondary | ICD-10-CM

## 2024-06-14 NOTE — Telephone Encounter (Signed)
 Patient needs an office visit in the next 30 days to continue getting refills he is over due for his CPE

## 2024-07-10 ENCOUNTER — Other Ambulatory Visit: Payer: Self-pay | Admitting: Nurse Practitioner

## 2024-07-10 DIAGNOSIS — I10 Essential (primary) hypertension: Secondary | ICD-10-CM

## 2024-07-31 ENCOUNTER — Other Ambulatory Visit

## 2024-08-07 ENCOUNTER — Encounter: Payer: Self-pay | Admitting: Nurse Practitioner

## 2024-08-07 ENCOUNTER — Ambulatory Visit: Admitting: Nurse Practitioner

## 2024-08-07 VITALS — BP 126/86 | HR 85 | Temp 97.7°F | Ht 74.0 in | Wt 175.0 lb

## 2024-08-07 DIAGNOSIS — Z1159 Encounter for screening for other viral diseases: Secondary | ICD-10-CM

## 2024-08-07 DIAGNOSIS — Z87891 Personal history of nicotine dependence: Secondary | ICD-10-CM | POA: Diagnosis not present

## 2024-08-07 DIAGNOSIS — Z0000 Encounter for general adult medical examination without abnormal findings: Secondary | ICD-10-CM

## 2024-08-07 DIAGNOSIS — R600 Localized edema: Secondary | ICD-10-CM | POA: Diagnosis not present

## 2024-08-07 DIAGNOSIS — Z125 Encounter for screening for malignant neoplasm of prostate: Secondary | ICD-10-CM

## 2024-08-07 DIAGNOSIS — I10 Essential (primary) hypertension: Secondary | ICD-10-CM | POA: Diagnosis not present

## 2024-08-07 DIAGNOSIS — Z23 Encounter for immunization: Secondary | ICD-10-CM

## 2024-08-07 LAB — LIPID PANEL
Cholesterol: 187 mg/dL (ref 0–200)
HDL: 60.3 mg/dL (ref >39.00)
LDL Cholesterol: 112 mg/dL — ABNORMAL HIGH (ref 0–99)
NonHDL: 126.67
Total CHOL/HDL Ratio: 3
Triglycerides: 72 mg/dL (ref 0.0–149.0)
VLDL: 14.4 mg/dL (ref 0.0–40.0)

## 2024-08-07 LAB — CBC WITH DIFFERENTIAL/PLATELET
Basophils Absolute: 0 K/uL (ref 0.0–0.1)
Basophils Relative: 0.6 % (ref 0.0–3.0)
Eosinophils Absolute: 0.1 K/uL (ref 0.0–0.7)
Eosinophils Relative: 3.1 % (ref 0.0–5.0)
HCT: 43.8 % (ref 39.0–52.0)
Hemoglobin: 14.4 g/dL (ref 13.0–17.0)
Lymphocytes Relative: 20.6 % (ref 12.0–46.0)
Lymphs Abs: 1 K/uL (ref 0.7–4.0)
MCHC: 33 g/dL (ref 30.0–36.0)
MCV: 90.3 fl (ref 78.0–100.0)
Monocytes Absolute: 0.6 K/uL (ref 0.1–1.0)
Monocytes Relative: 11.7 % (ref 3.0–12.0)
Neutro Abs: 3.1 K/uL (ref 1.4–7.7)
Neutrophils Relative %: 64 % (ref 43.0–77.0)
Platelets: 207 K/uL (ref 150.0–400.0)
RBC: 4.85 Mil/uL (ref 4.22–5.81)
RDW: 14.4 % (ref 11.5–15.5)
WBC: 4.9 K/uL (ref 4.0–10.5)

## 2024-08-07 LAB — URINALYSIS, MICROSCOPIC ONLY: RBC / HPF: NONE SEEN (ref 0–?)

## 2024-08-07 LAB — COMPREHENSIVE METABOLIC PANEL WITH GFR
ALT: 11 U/L (ref 0–53)
AST: 14 U/L (ref 0–37)
Albumin: 4 g/dL (ref 3.5–5.2)
Alkaline Phosphatase: 50 U/L (ref 39–117)
BUN: 13 mg/dL (ref 6–23)
CO2: 29 meq/L (ref 19–32)
Calcium: 9.2 mg/dL (ref 8.4–10.5)
Chloride: 103 meq/L (ref 96–112)
Creatinine, Ser: 1.14 mg/dL (ref 0.40–1.50)
GFR: 61.37 mL/min (ref >60.00)
Glucose, Bld: 93 mg/dL (ref 70–99)
Potassium: 4.4 meq/L (ref 3.5–5.1)
Sodium: 139 meq/L (ref 135–145)
Total Bilirubin: 0.7 mg/dL (ref 0.2–1.2)
Total Protein: 6.7 g/dL (ref 6.0–8.3)

## 2024-08-07 LAB — TSH: TSH: 1.97 u[IU]/mL (ref 0.35–5.50)

## 2024-08-07 LAB — PSA, MEDICARE: PSA: 1.01 ng/mL (ref 0.10–4.00)

## 2024-08-07 NOTE — Assessment & Plan Note (Signed)
 Discussed age-appropriate immunizations and screening exams.  Did review patient's personal, surgical, social, family histories.  Patient is up-to-date on all age-appropriate vaccinations he would like.  Update flu vaccine today.  Shingles vaccine discussed in office and get at local pharmacy.  Patient has aged out and declines CRC screening.  PSA for prostate cancer screening today.  Patient was given information at discharge about preventative healthcare maintenance with anticipatory guidance.

## 2024-08-07 NOTE — Assessment & Plan Note (Signed)
 Pending urine microscopy rule out microscopic hematuria

## 2024-08-07 NOTE — Patient Instructions (Signed)
 Nice to see you today I will be in touch with the labs once I have them  Follow up with me in 1 year, sooner if you need me  We did up date your flu vaccine today   Consider getting the shingles vaccine (shingrix) at the local pharmacy

## 2024-08-07 NOTE — Assessment & Plan Note (Signed)
 Slight edema today patient will take Lasix  20 mg daily as needed.  Continue

## 2024-08-07 NOTE — Progress Notes (Signed)
 Established Patient Office Visit  Subjective   Patient ID: Albert Mendoza, male    DOB: 1946-05-21  Age: 78 y.o. MRN: [id number]  Chief Complaint  Patient presents with   Annual Exam    High dose flu    HPI  HTN: Currently maintained on furosemide  20 mg as needed and losartan  25 mg daily. He is taking the lasix  a couple weeks ago. He is checking his blood pressure with a wrist cuff    for complete physical and follow up of chronic conditions.  Immunizations: -Tetanus: Completed in 2022 -Influenza: up date today  -Shingles: Discussed at office get at local pharmacy -Pneumonia: Completed 2024  Diet: Fair diet. He is eating often through out the day and grazes and will have 2 meals. He drinks coffee and water. He will do propel water  Exercise: No regular exercise. States that he has a younger dog and having to wlak them.  Eye exam: needs updating. Wears galsses  Dental exam: Needs updating     Colonoscopy: Aged out Lung Cancer Screening: N/A  PSA: Due  Sleep: states that he will gets at least 6 hours of sleep at night. He does take him a nap during the day   Advanced directive: states that he has a daughter. No legal document. States that it has been written        Review of Systems  Constitutional:  Negative for chills and fever.  Respiratory:  Negative for shortness of breath.   Cardiovascular:  Negative for chest pain and leg swelling.  Gastrointestinal:  Negative for abdominal pain, blood in stool, constipation, diarrhea, nausea and vomiting.       BM daily   Genitourinary:  Negative for dysuria and hematuria.  Neurological:  Negative for dizziness, tingling and headaches.  Psychiatric/Behavioral:  Negative for hallucinations and suicidal ideas.       Objective:     BP 126/86   Pulse 85   Temp 97.7 F (36.5 C) (Oral)   Ht 6' 2 (1.88 m)   Wt 175 lb (79.4 kg)   SpO2 95%   BMI 22.47 kg/m  BP Readings from Last 3 Encounters:  08/07/24 126/86   08/31/23 124/88  08/03/23 138/80   Wt Readings from Last 3 Encounters:  08/07/24 175 lb (79.4 kg)  08/31/23 170 lb (77.1 kg)  08/03/23 176 lb 12.8 oz (80.2 kg)   SpO2 Readings from Last 3 Encounters:  08/07/24 95%  08/31/23 97%  08/03/23 97%      Physical Exam Vitals and nursing note reviewed.  Constitutional:      Appearance: Normal appearance.  HENT:     Right Ear: Tympanic membrane, ear canal and external ear normal.     Left Ear: Tympanic membrane, ear canal and external ear normal.     Mouth/Throat:     Mouth: Mucous membranes are moist.     Pharynx: Oropharynx is clear.  Eyes:     Extraocular Movements: Extraocular movements intact.     Pupils: Pupils are equal, round, and reactive to light.  Cardiovascular:     Rate and Rhythm: Normal rate and regular rhythm.     Pulses: Normal pulses.     Heart sounds: Normal heart sounds.  Pulmonary:     Effort: Pulmonary effort is normal.     Breath sounds: Normal breath sounds.  Abdominal:     General: Bowel sounds are normal. There is no distension.     Palpations: There is no mass.  Tenderness: There is no abdominal tenderness.     Hernia: No hernia is present.  Musculoskeletal:     Right lower leg: Edema present.     Left lower leg: Edema present.  Lymphadenopathy:     Cervical: No cervical adenopathy.  Skin:    General: Skin is warm.  Neurological:     General: No focal deficit present.     Mental Status: He is alert.     Deep Tendon Reflexes:     Reflex Scores:      Bicep reflexes are 2+ on the right side and 2+ on the left side.      Patellar reflexes are 2+ on the right side and 2+ on the left side.    Comments: Bilateral upper and lower extremity strength 5/5  Psychiatric:        Mood and Affect: Mood normal.        Behavior: Behavior normal.        Thought Content: Thought content normal.        Judgment: Judgment normal.      No results found for any visits on 08/07/24.    The ASCVD Risk  score (Arnett DK, et al., 2019) failed to calculate for the following reasons:   Cannot find a previous HDL lab   Cannot find a previous total cholesterol lab    Assessment & Plan:   Problem List Items Addressed This Visit       Cardiovascular and Mediastinum   Primary hypertension   Patient currently maintained on losartan  25 mg daily.  Blood pressure controlled.  Checking blood pressure at home.  Continue medication as prescribed      Relevant Orders   Comprehensive metabolic panel with GFR   CBC with Differential/Platelet   Lipid panel   TSH     Other   Bilateral lower extremity edema   Slight edema today patient will take Lasix  20 mg daily as needed.  Continue      Relevant Orders   TSH   Preventative health care - Primary   Discussed age-appropriate immunizations and screening exams.  Did review patient's personal, surgical, social, family histories.  Patient is up-to-date on all age-appropriate vaccinations he would like.  Update flu vaccine today.  Shingles vaccine discussed in office and get at local pharmacy.  Patient has aged out and declines CRC screening.  PSA for prostate cancer screening today.  Patient was given information at discharge about preventative healthcare maintenance with anticipatory guidance.      Former smoker   Pending urine microscopy rule out microscopic hematuria      Relevant Orders   Urine Microscopic   Other Visit Diagnoses       Encounter for hepatitis C screening test for low risk patient       Relevant Orders   Hepatitis C antibody     Screening for prostate cancer       Relevant Orders   PSA, Medicare     Need for influenza vaccination       Relevant Orders   Flu vaccine HIGH DOSE PF(Fluzone Trivalent) (Completed)       Return in about 1 year (around 08/07/2025) for CPE and Labs.    Adina Crandall, NP

## 2024-08-07 NOTE — Assessment & Plan Note (Signed)
 Patient currently maintained on losartan  25 mg daily.  Blood pressure controlled.  Checking blood pressure at home.  Continue medication as prescribed

## 2024-08-08 LAB — HEPATITIS C ANTIBODY: Hepatitis C Ab: NONREACTIVE

## 2024-08-09 ENCOUNTER — Ambulatory Visit: Payer: Self-pay | Admitting: Nurse Practitioner

## 2024-08-10 ENCOUNTER — Other Ambulatory Visit: Payer: Self-pay | Admitting: Nurse Practitioner

## 2024-08-10 DIAGNOSIS — I10 Essential (primary) hypertension: Secondary | ICD-10-CM

## 2024-11-29 ENCOUNTER — Ambulatory Visit

## 2024-12-25 ENCOUNTER — Ambulatory Visit

## 2024-12-25 VITALS — Ht 74.0 in | Wt 175.0 lb

## 2024-12-25 DIAGNOSIS — Z0000 Encounter for general adult medical examination without abnormal findings: Secondary | ICD-10-CM | POA: Diagnosis not present

## 2024-12-25 NOTE — Progress Notes (Signed)
 "  Chief Complaint  Patient presents with   Medicare Wellness     Subjective:   Albert Mendoza is a 79 y.o. male who presents for a Medicare Annual Wellness Visit.  Visit info / Clinical Intake: Medicare Wellness Visit Type:: Subsequent Annual Wellness Visit Persons participating in visit and providing information:: patient Medicare Wellness Visit Mode:: Telephone If telephone:: video declined Since this visit was completed virtually, some vitals may be partially provided or unavailable. Missing vitals are due to the limitations of the virtual format.: Unable to obtain vitals - no equipment If Telephone or Video please confirm:: I connected with patient using audio/video enable telemedicine. I verified patient identity with two identifiers, discussed telehealth limitations, and patient agreed to proceed. Patient Location:: home Provider Location:: home office Interpreter Needed?: No Pre-visit prep was completed: yes AWV questionnaire completed by patient prior to visit?: no Living arrangements:: (!) lives alone Patient's Overall Health Status Rating: very good Typical amount of pain: some Does pain affect daily life?: no (having a flare of pain in elbow and knee) Are you currently prescribed opioids?: no  Dietary Habits and Nutritional Risks How many meals a day?: 2 Most meals are obtained by: preparing own meals; eating out (a mix of both but more at home) In the last 2 weeks, have you had any of the following?: none Diabetic:: no  Functional Status Activities of Daily Living (to include ambulation/medication): Independent Ambulation: Independent Medication Administration: Independent Home Management (perform basic housework or laundry): Independent Manage your own finances?: yes Primary transportation is: driving Concerns about vision?: no *vision screening is required for WTM* Concerns about hearing?: no  Fall Screening Falls in the past year?: 0 Number of falls in  past year: 0 Was there an injury with Fall?: 0 Fall Risk Category Calculator: 0 Patient Fall Risk Level: Low Fall Risk  Fall Risk Patient at Risk for Falls Due to: No Fall Risks Fall risk Follow up: Falls evaluation completed; Education provided; Falls prevention discussed  Home and Transportation Safety: All rugs have non-skid backing?: yes All stairs or steps have railings?: yes Grab bars in the bathtub or shower?: yes Have non-skid surface in bathtub or shower?: yes Good home lighting?: yes Regular seat belt use?: yes Hospital stays in the last year:: no  Cognitive Assessment Difficulty concentrating, remembering, or making decisions? : no Will 6CIT or Mini Cog be Completed: yes What year is it?: 0 points What month is it?: 0 points Give patient an address phrase to remember (5 components): [address] [place]  About what time is it?: 0 points Count backwards from 20 to 1: 0 points Say the months of the year in reverse: 0 points Repeat the address phrase from earlier: 0 points 6 CIT Score: 0 points  Advance Directives (For Healthcare) Does Patient Have a Medical Advance Directive?: No Would patient like information on creating a medical advance directive?: Yes (MAU/Ambulatory/Procedural Areas - Information given)  Reviewed/Updated  Reviewed/Updated: Reviewed All (Medical, Surgical, Family, Medications, Allergies, Care Teams, Patient Goals)    Allergies (verified) Patient has no known allergies.   Current Medications (verified) Outpatient Encounter Medications as of 12/25/2024  Medication Sig   furosemide  (LASIX ) 20 MG tablet TAKE 1 TABLET BY MOUTH EVERY DAY AS NEEDED   losartan  (COZAAR ) 25 MG tablet TAKE 1 TABLET (25 MG TOTAL) BY MOUTH DAILY.   No facility-administered encounter medications on file as of 12/25/2024.    History: Past Medical History:  Diagnosis Date   Arthritis  Eating disorder    Past Surgical History:  Procedure Laterality Date    HERNIA REPAIR     Family History  Problem Relation Age of Onset   Diabetes Mother    Cancer Mother    Diabetes Father    Social History   Occupational History   Not on file  Tobacco Use   Smoking status: Former    Current packs/day: 0.00    Average packs/day: 1 pack/day for 4.0 years (4.0 ttl pk-yrs)    Types: Cigarettes    Start date: 11/28/1998    Quit date: 11/28/2002    Years since quitting: 22.0   Smokeless tobacco: Current    Types: Snuff  Vaping Use   Vaping status: Never Used  Substance and Sexual Activity   Alcohol use: Yes    Comment: pint a week   Drug use: Never   Sexual activity: Not Currently   Tobacco Counseling Ready to quit: Not Answered Counseling given: Not Answered  SDOH Screenings   Food Insecurity: No Food Insecurity (12/25/2024)  Housing: Unknown (12/25/2024)  Transportation Needs: No Transportation Needs (12/25/2024)  Utilities: Not At Risk (12/25/2024)  Alcohol Screen: Low Risk (12/25/2024)  Depression (PHQ2-9): Low Risk (12/25/2024)  Financial Resource Strain: Low Risk (12/25/2024)  Physical Activity: Insufficiently Active (12/25/2024)  Social Connections: Socially Isolated (12/25/2024)  Stress: No Stress Concern Present (12/25/2024)  Tobacco Use: High Risk (12/25/2024)  Health Literacy: Adequate Health Literacy (12/25/2024)   See flowsheets for full screening details  Depression Screen PHQ 2 & 9 Depression Scale- Over the past 2 weeks, how often have you been bothered by any of the following problems? Little interest or pleasure in doing things: 0 Feeling down, depressed, or hopeless (PHQ Adolescent also includes...irritable): 0 PHQ-2 Total Score: 0 Trouble falling or staying asleep, or sleeping too much: 0 Feeling tired or having little energy: 0 Poor appetite or overeating (PHQ Adolescent also includes...weight loss): 0 Feeling bad about yourself - or that you are a failure or have let yourself or your family down: 0 Trouble concentrating on  things, such as reading the newspaper or watching television (PHQ Adolescent also includes...like school work): 0 Moving or speaking so slowly that other people could have noticed. Or the opposite - being so fidgety or restless that you have been moving around a lot more than usual: 0 Thoughts that you would be better off dead, or of hurting yourself in some way: 0 PHQ-9 Total Score: 0 If you checked off any problems, how difficult have these problems made it for you to do your work, take care of things at home, or get along with other people?: Not difficult at all     Goals Addressed             This Visit's Progress    Patient Stated   Not on track    Increase exercise     Patient Stated       I would like to continue to walk at least 1 mile per day and maintain my health             Objective:    Today's Vitals   12/25/24 1145  Weight: 175 lb (79.4 kg)  Height: 6' 2 (1.88 m)   Body mass index is 22.47 kg/m.  Hearing/Vision screen No results found. Immunizations and Health Maintenance Health Maintenance  Topic Date Due   Zoster Vaccines- Shingrix (1 of 2) Never done   COVID-19 Vaccine (1 - 2025-26 season) Never done  Medicare Annual Wellness (AWV)  12/25/2025   DTaP/Tdap/Td (2 - Td or Tdap) 09/21/2031   Pneumococcal Vaccine: 50+ Years  Completed   Influenza Vaccine  Completed   Hepatitis C Screening  Completed   Meningococcal B Vaccine  Aged Out        Assessment/Plan:  This is a routine wellness examination for Albert Mendoza.  Patient Care Team: Wendee Lynwood HERO, NP as PCP - General (Nurse Practitioner)  I have personally reviewed and noted the following in the patients chart:   Medical and social history Use of alcohol, tobacco or illicit drugs  Current medications and supplements including opioid prescriptions. Functional ability and status Nutritional status Physical activity Advanced directives List of other physicians Hospitalizations, surgeries,  and ER visits in previous 12 months Vitals Screenings to include cognitive, depression, and falls Referrals and appointments  No orders of the defined types were placed in this encounter.  In addition, I have reviewed and discussed with patient certain preventive protocols, quality metrics, and best practice recommendations. A written personalized care plan for preventive services as well as general preventive health recommendations were provided to patient.   Albert LITTIE Saris, LPN   8/71/7973   Return in 1 year (on 12/25/2025) for annual wellness.  After Visit Summary: (Declined) Due to this being a telephonic visit, with patients personalized plan was offered to patient but patient Declined AVS at this time   Nurse Notes: No voiced or noted concerns at this time Patient advised to keep follow-up appointment with PCP (Sept 2026) Appointment(s) made: (Sept 2026 for CPE) Vaccines not given: Shingles and Covid declined today  "

## 2024-12-25 NOTE — Patient Instructions (Signed)
 Mr. Mizuno,  Thank you for taking the time for your Medicare Wellness Visit. I appreciate your continued commitment to your health goals. Please review the care plan we discussed, and feel free to reach out if I can assist you further.  Please note that Annual Wellness Visits do not include a physical exam. Some assessments may be limited, especially if the visit was conducted virtually. If needed, we may recommend an in-person follow-up with your provider.  Ongoing Care Seeing your primary care provider every 3 to 6 months helps us  monitor your health and provide consistent, personalized care.   Referrals If a referral was made during today's visit and you haven't received any updates within two weeks, please contact the referred provider directly to check on the status.  Recommended Screenings:  Health Maintenance  Topic Date Due   Zoster (Shingles) Vaccine (1 of 2) Never done   COVID-19 Vaccine (1 - 2025-26 season) Never done   Medicare Annual Wellness Visit  08/02/2024   DTaP/Tdap/Td vaccine (2 - Td or Tdap) 09/21/2031   Pneumococcal Vaccine for age over 10  Completed   Flu Shot  Completed   Hepatitis C Screening  Completed   Meningitis B Vaccine  Aged Out       12/25/2024   11:54 AM  Advanced Directives  Does Patient Have a Medical Advance Directive? No  Would patient like information on creating a medical advance directive? Yes (MAU/Ambulatory/Procedural Areas - Information given)    Vision: Annual vision screenings are recommended for early detection of glaucoma, cataracts, and diabetic retinopathy. These exams can also reveal signs of chronic conditions such as diabetes and high blood pressure.  Dental: Annual dental screenings help detect early signs of oral cancer, gum disease, and other conditions linked to overall health, including heart disease and diabetes.  Please see the attached documents for additional preventive care recommendations.

## 2025-08-12 ENCOUNTER — Encounter: Admitting: Nurse Practitioner
